# Patient Record
Sex: Female | Born: 1975 | Hispanic: Yes | Marital: Married | Smoking: Never smoker
Health system: Southern US, Community
[De-identification: ages and names within clinical notes are randomized; demographics above are authoritative.]

---

## 2014-02-13 ENCOUNTER — Ambulatory Visit (INDEPENDENT_AMBULATORY_CARE_PROVIDER_SITE_OTHER): Payer: Managed Care, Other (non HMO)

## 2014-02-13 ENCOUNTER — Ambulatory Visit (INDEPENDENT_AMBULATORY_CARE_PROVIDER_SITE_OTHER): Payer: Managed Care, Other (non HMO) | Admitting: Family Medicine

## 2014-02-13 ENCOUNTER — Encounter: Payer: Self-pay | Admitting: Family Medicine

## 2014-02-13 ENCOUNTER — Ambulatory Visit (INDEPENDENT_AMBULATORY_CARE_PROVIDER_SITE_OTHER): Payer: Managed Care, Other (non HMO) | Admitting: *Deleted

## 2014-02-13 ENCOUNTER — Encounter (INDEPENDENT_AMBULATORY_CARE_PROVIDER_SITE_OTHER): Payer: Self-pay

## 2014-02-13 VITALS — BP 106/63 | HR 87 | Temp 98.0°F | Ht 64.0 in | Wt 118.0 lb

## 2014-02-13 DIAGNOSIS — M25511 Pain in right shoulder: Secondary | ICD-10-CM

## 2014-02-13 DIAGNOSIS — M542 Cervicalgia: Secondary | ICD-10-CM

## 2014-02-13 DIAGNOSIS — Z23 Encounter for immunization: Secondary | ICD-10-CM

## 2014-02-13 MED ORDER — HYDROCODONE-ACETAMINOPHEN 5-325 MG PO TABS: 1.0000 | ORAL_TABLET | Freq: Four times a day (QID) | ORAL | Status: DC | PRN

## 2014-02-13 MED ORDER — NAPROXEN 500 MG PO TABS: 500.0000 mg | ORAL_TABLET | Freq: Two times a day (BID) | ORAL | Status: DC

## 2014-02-13 NOTE — Progress Notes (Signed)
   Subjective:    Patient ID: Nicholaus CorollaSonia Rodino, female    DOB: [birth year], 38 y.o.   MRN: [id number]  HPI Patient is here for c/o right sided shoulder pain and discomfort.  She states a month ago she was picking up a heavy object at work and she felt it pull her neck and shoulder and since she has been having difficulty raising her arm above her head and she is having some neck discomfort.  Review of Systems  Constitutional: Negative for fever.  HENT: Negative for ear pain.   Eyes: Negative for discharge.  Respiratory: Negative for cough.   Cardiovascular: Negative for chest pain.  Gastrointestinal: Negative for abdominal distention.  Endocrine: Negative for polyuria.  Genitourinary: Negative for difficulty urinating.  Musculoskeletal: Positive for myalgias. Negative for gait problem and neck pain.       C/o right shoulder discomfort and cervical spine discomfort.  Skin: Negative for color change and rash.  Neurological: Negative for speech difficulty and headaches.  Psychiatric/Behavioral: Negative for agitation.       Objective:    BP 106/63 mmHg  Pulse 87  Temp(Src) 98 F (36.7 C) (Oral)  Ht 5\' 4"  (1.626 m)  Wt 118 lb (53.524 kg)  BMI 20.24 kg/m2  LMP 02/07/2014 Physical Exam  Constitutional: She is oriented to person, place, and time. She appears well-developed and well-nourished.  HENT:  Head: Normocephalic and atraumatic.  Mouth/Throat: Oropharynx is clear and moist.  Eyes: Pupils are equal, round, and reactive to light.  Neck: Normal range of motion. Neck supple.  Cardiovascular: Normal rate and regular rhythm.   No murmur heard. Pulmonary/Chest: Effort normal and breath sounds normal.  Abdominal: Soft. Bowel sounds are normal. There is no tenderness.  Musculoskeletal: She exhibits tenderness.  Decreased ROM cervical spine and right arm with abduction.  Neurological: She is alert and oriented to person, place, and time.  Skin: Skin is warm and dry.  Psychiatric: She  has a normal mood and affect.   Cervical Spine Xray - No fx's Right shoulder Xray - Possible seperation right shoulder. Prelimnary reading by Angeline SlimWilliam Roseland Braun,FNP       Assessment & Plan:     ICD-9-CM ICD-10-CM   1. Pain in joint, shoulder region, right 719.41 M25.511 DG Cervical Spine Complete     naproxen (NAPROSYN) 500 MG tablet     HYDROcodone-acetaminophen (NORCO) 5-325 MG per tablet     DG Shoulder Right     DG Cervical Spine Complete  2. Cervicalgia 723.1 M54.2 naproxen (NAPROSYN) 500 MG tablet     HYDROcodone-acetaminophen (NORCO) 5-325 MG per tablet     DG Shoulder Right     DG Cervical Spine Complete     No Follow-up on file.  Deatra CanterWilliam J Neythan Kozlov FNP

## 2015-11-23 IMAGING — CR DG CERVICAL SPINE COMPLETE 4+V
5 series · 5 of 5 positions shown · non-contrast
Comparison: None.

CLINICAL DATA: Right shoulder pain radiating to the right arm

EXAM:
CERVICAL SPINE  4+ VIEWS

[view not recorded (1 of 5)]
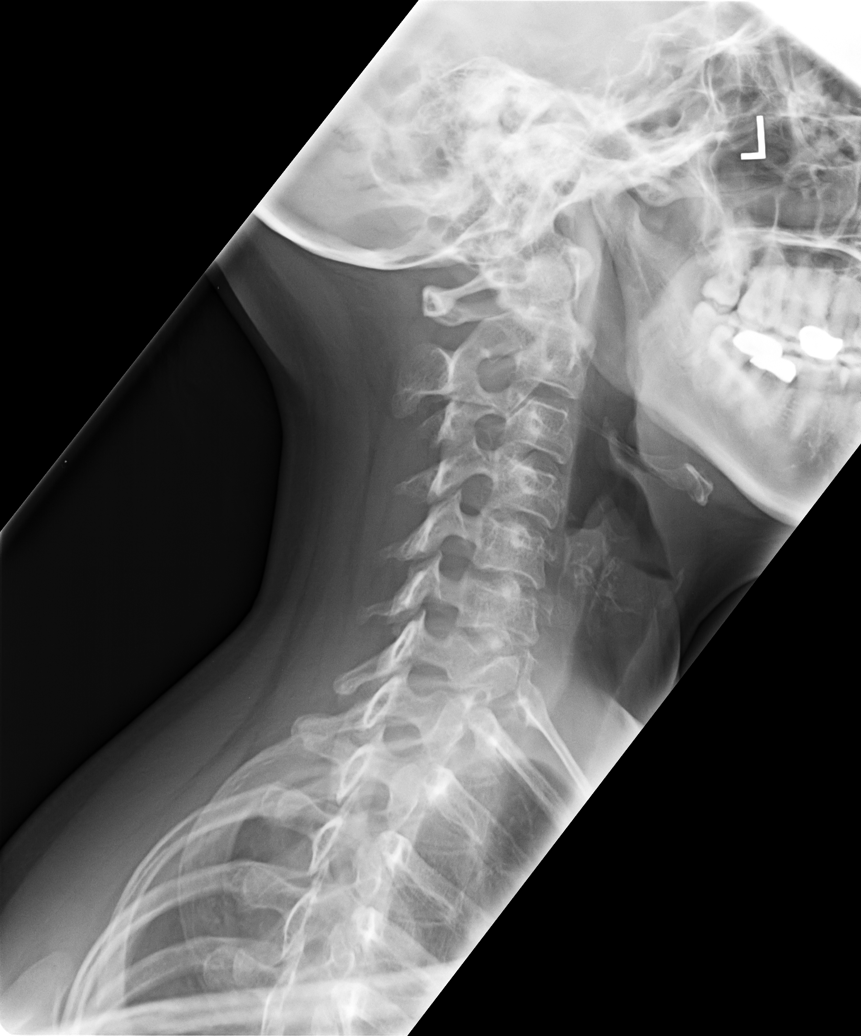

[view not recorded (2 of 5)]
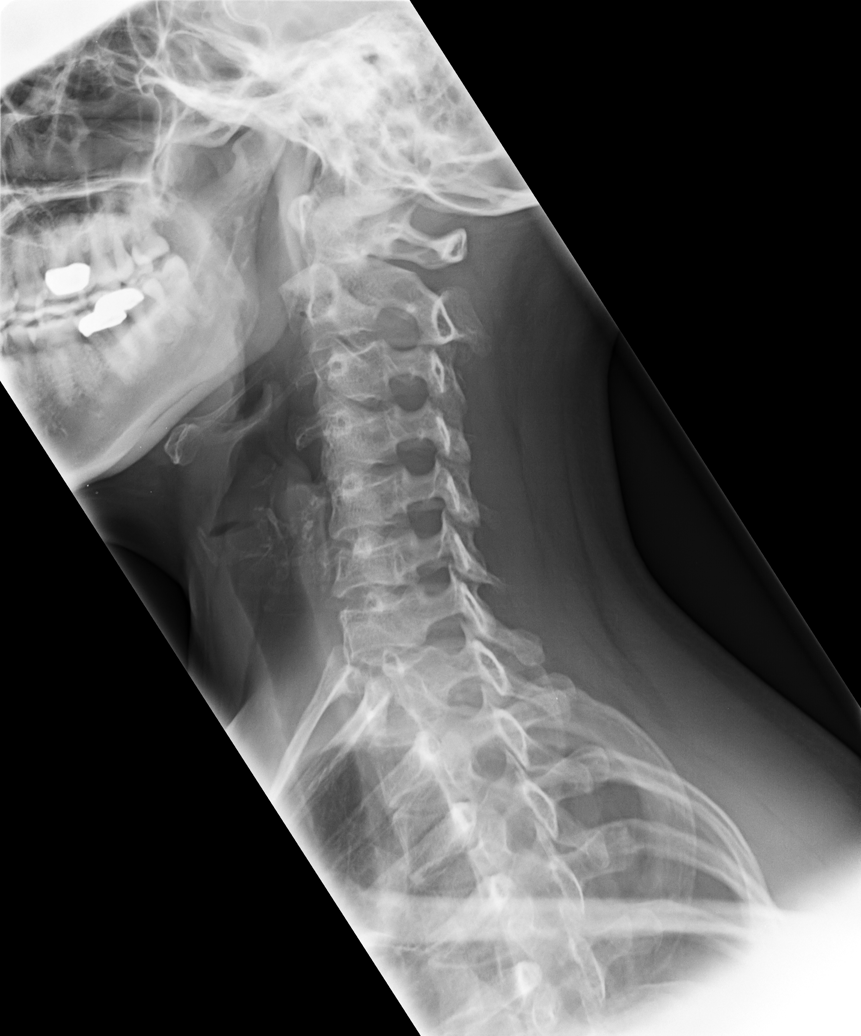

[view not recorded (3 of 5)]
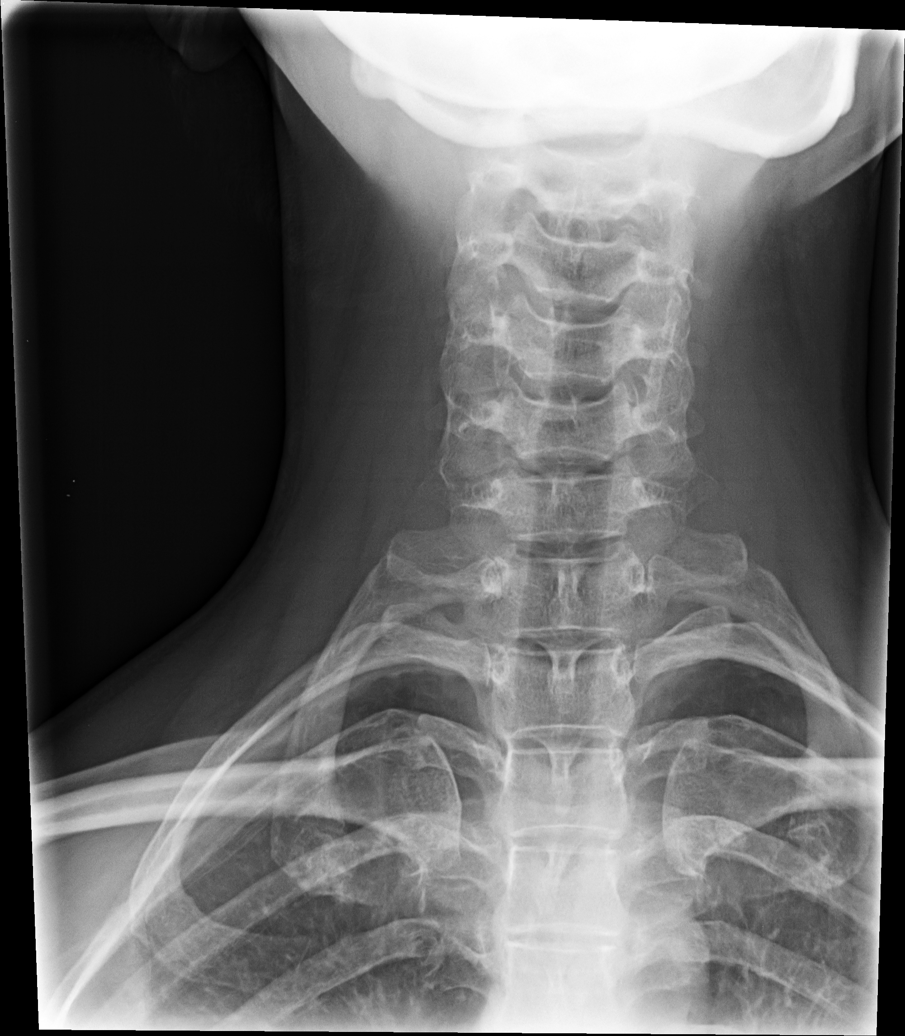

[view not recorded (4 of 5)]
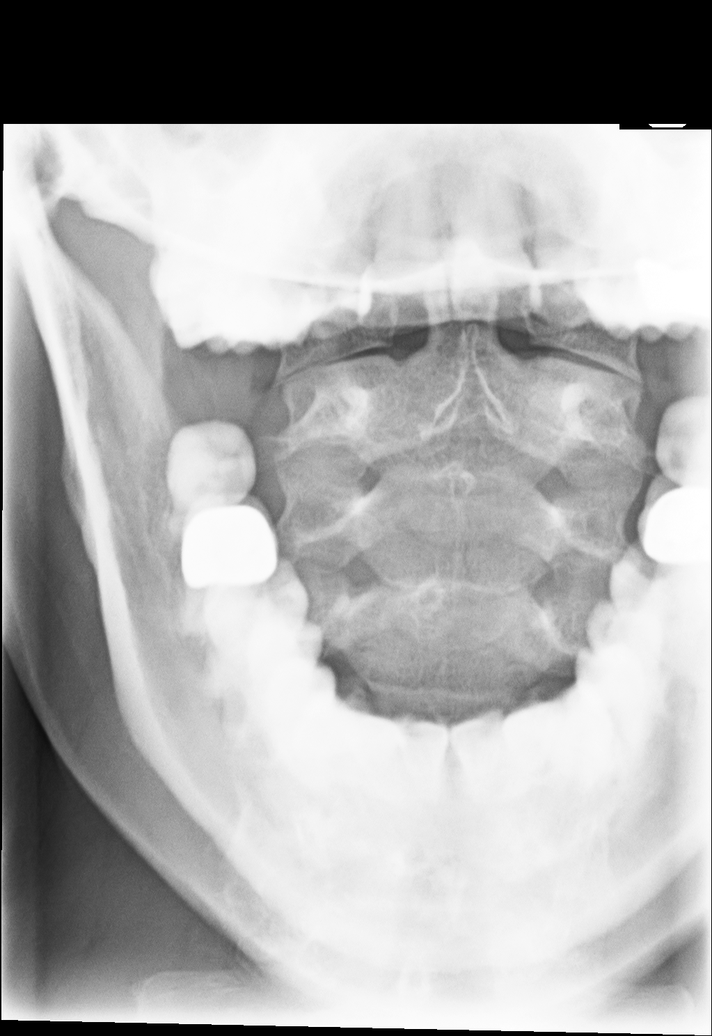

[view not recorded (5 of 5)]
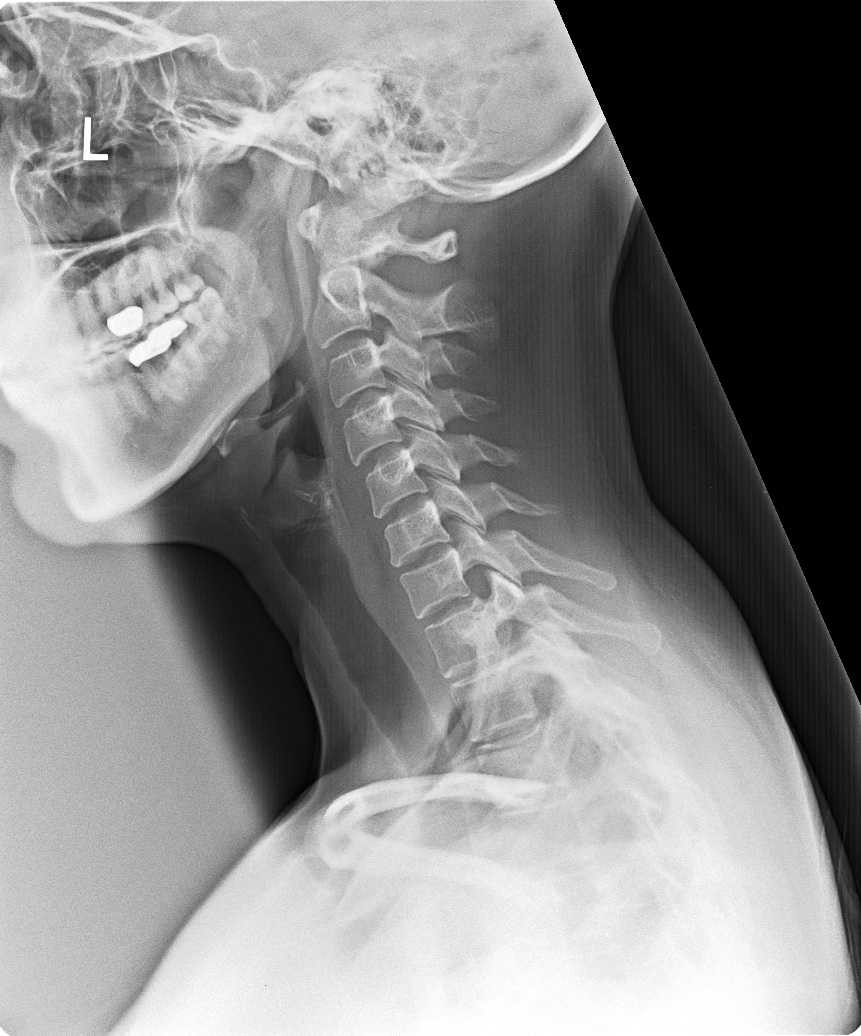

[5 of 5 positions shown; findings below may reference images not displayed]

FINDINGS: The cervical vertebrae are straightened in alignment. Intervertebral
disc spaces appear normal. No significant degenerative disc disease
is seen. No prevertebral soft tissue swelling is noted. On oblique
views, the foramina are patent. The odontoid process is intact. The
lung apices are clear.
IMPRESSION: Straightened alignment. Intervertebral disc spaces are grossly
normal.

## 2016-01-31 ENCOUNTER — Encounter: Payer: Self-pay | Admitting: Nurse Practitioner

## 2016-01-31 ENCOUNTER — Ambulatory Visit (INDEPENDENT_AMBULATORY_CARE_PROVIDER_SITE_OTHER): Payer: Managed Care, Other (non HMO) | Admitting: Nurse Practitioner

## 2016-01-31 VITALS — BP 102/69 | HR 81 | Temp 97.6°F | Ht 64.0 in | Wt 121.0 lb

## 2016-01-31 DIAGNOSIS — N3000 Acute cystitis without hematuria: Secondary | ICD-10-CM | POA: Diagnosis not present

## 2016-01-31 DIAGNOSIS — M545 Low back pain, unspecified: Secondary | ICD-10-CM

## 2016-01-31 LAB — URINALYSIS, COMPLETE
BILIRUBIN UA: NEGATIVE
GLUCOSE, UA: NEGATIVE
Ketones, UA: NEGATIVE
Nitrite, UA: NEGATIVE
PH UA: 7.5 (ref 5.0–7.5)
PROTEIN UA: NEGATIVE
RBC UA: NEGATIVE
Specific Gravity, UA: 1.01 (ref 1.005–1.030)
Urobilinogen, Ur: 0.2 mg/dL (ref 0.2–1.0)

## 2016-01-31 LAB — MICROSCOPIC EXAMINATION
Epithelial Cells (non renal): >10 /hpf — AB (ref 0–10)
RENAL EPITHEL UA: NONE SEEN /HPF

## 2016-01-31 MED ORDER — NITROFURANTOIN MONOHYD MACRO 100 MG PO CAPS: 100.0000 mg | ORAL_CAPSULE | Freq: Two times a day (BID) | ORAL | 0 refills | Status: DC

## 2016-01-31 MED ORDER — CYCLOBENZAPRINE HCL 10 MG PO TABS: 10.0000 mg | ORAL_TABLET | Freq: Three times a day (TID) | ORAL | 1 refills | Status: DC | PRN

## 2016-01-31 MED ORDER — PREDNISONE 20 MG PO TABS: ORAL_TABLET | ORAL | 0 refills | Status: DC

## 2016-01-31 NOTE — Progress Notes (Signed)
   Subjective:    Patient ID: Nicholaus CorollaSonia Cremeans, female    DOB: April 23, 1975, 40 y.o.   MRN: [id number]  HPI Patient in c/o left lower back pain started. Noticed it on Tuesday afternoon and has gradually gotten worse. Has moved down in to her butt and around flank. Rates pain 7/10 currently. Denies any injury. She has tried ibuprofen and it only helps just a little. Pain increases with movement and improves with sitting still.    Review of Systems  Constitutional: Negative.   HENT: Negative.   Respiratory: Negative.   Cardiovascular: Negative.   Gastrointestinal: Negative.   Genitourinary: Negative.  Negative for dysuria, frequency and urgency.  Musculoskeletal: Positive for back pain (left lower back).  Neurological: Negative.   Psychiatric/Behavioral: Negative.   All other systems reviewed and are negative.      Objective:   Physical Exam  Constitutional: She is oriented to person, place, and time. She appears well-developed and well-nourished. No distress.  Cardiovascular: Normal rate, regular rhythm and normal heart sounds.   Abdominal: Soft. Bowel sounds are normal. She exhibits no distension. There is no tenderness. There is no rebound.  Musculoskeletal:  Rises slowly from sitting position with slight limp on left leg. FROM of lumbar spine with pain on rotatoion to right nad flexion. (-) SLR bil motort strength and sensation distally intact.  Neurological: She is alert and oriented to person, place, and time. She has normal reflexes.  Skin: Skin is warm.  Psychiatric: She has a normal mood and affect. Her behavior is normal. Judgment and thought content normal.   BP 102/69   Pulse 81   Temp 97.6 F (36.4 C) (Oral)   Ht 5\' 4"  (1.626 m)   Wt 121 lb (54.9 kg)   BMI 20.77 kg/m   UA- leuks 2+     Assessment & Plan:  1. Acute left-sided low back pain without sciatica Moist heat No heavy lifting or stooping Do not drive when take muscle relaxor - Urinalysis, Complete -  predniSONE (DELTASONE) 20 MG tablet; 2 po at sametime daily for 5 days  Dispense: 10 tablet; Refill: 0 - cyclobenzaprine (FLEXERIL) 10 MG tablet; Take 1 tablet (10 mg total) by mouth 3 (three) times daily as needed for muscle spasms.  Dispense: 30 tablet; Refill: 1  2. Acute cystitis without hematuria Force fluids RTO prn - nitrofurantoin, macrocrystal-monohydrate, (MACROBID) 100 MG capsule; Take 1 capsule (100 mg total) by mouth 2 (two) times daily. 1 po BId  Dispense: 14 capsule; Refill: 0  Mary-Margaret Daphine DeutscherMartin, FNP

## 2016-01-31 NOTE — Patient Instructions (Signed)
Back Pain, Adult Introduction Back pain is very common. The pain often gets better over time. The cause of back pain is usually not dangerous. Most people can learn to manage their back pain on their own. Follow these instructions at home: Watch your back pain for any changes. The following actions may help to lessen any pain you are feeling:  Stay active. Start with short walks on flat ground if you can. Try to walk farther each day.  Exercise regularly as told by your doctor. Exercise helps your back heal faster. It also helps avoid future injury by keeping your muscles strong and flexible.  Do not sit, drive, or stand in one place for more than 30 minutes.  Do not stay in bed. Resting more than 1-2 days can slow down your recovery.  Be careful when you bend or lift an object. Use good form when lifting:  Bend at your knees.  Keep the object close to your body.  Do not twist.  Sleep on a firm mattress. Lie on your side, and bend your knees. If you lie on your back, put a pillow under your knees.  Take medicines only as told by your doctor.  Put ice on the injured area.  Put ice in a plastic bag.  Place a towel between your skin and the bag.  Leave the ice on for 20 minutes, 2-3 times a day for the first 2-3 days. After that, you can switch between ice and heat packs.  Avoid feeling anxious or stressed. Find good ways to deal with stress, such as exercise.  Maintain a healthy weight. Extra weight puts stress on your back. Contact a doctor if:  You have pain that does not go away with rest or medicine.  You have worsening pain that goes down into your legs or buttocks.  You have pain that does not get better in one week.  You have pain at night.  You lose weight.  You have a fever or chills. Get help right away if:  You cannot control when you poop (bowel movement) or pee (urinate).  Your arms or legs feel weak.  Your arms or legs lose feeling  (numbness).  You feel sick to your stomach (nauseous) or throw up (vomit).  You have belly (abdominal) pain.  You feel like you may pass out (faint). This information is not intended to replace advice given to you by your health care provider. Make sure you discuss any questions you have with your health care provider. Document Released: 08/19/2007 Document Revised: 08/08/2015 Document Reviewed: 07/04/2013  2017 Elsevier  

## 2016-12-24 ENCOUNTER — Encounter: Payer: Self-pay | Admitting: Family Medicine

## 2016-12-24 ENCOUNTER — Ambulatory Visit (INDEPENDENT_AMBULATORY_CARE_PROVIDER_SITE_OTHER): Payer: Managed Care, Other (non HMO) | Admitting: Family Medicine

## 2016-12-24 VITALS — BP 107/69 | HR 70 | Temp 97.5°F | Ht 64.0 in | Wt 116.8 lb

## 2016-12-24 DIAGNOSIS — Z23 Encounter for immunization: Secondary | ICD-10-CM | POA: Diagnosis not present

## 2016-12-24 DIAGNOSIS — G5601 Carpal tunnel syndrome, right upper limb: Secondary | ICD-10-CM

## 2016-12-24 NOTE — Progress Notes (Signed)
   HPI  Patient presents today here with right wrist pain.  Patient states she changed job at [employer], after that she began to develop right wrist pain and now numbness of her second third and fourth digit.  She has no weakness of the hand. She has been wearing a cock up splint for a few weeks at night with not much improvement.    PMH: Smoking status noted ROS: Per HPI  Objective: BP 107/69   Pulse 70   Temp (!) 97.5 F (36.4 C) (Oral)   Ht  (1.626 m)   Wt 116 lb 12.8 oz (53 kg)   BMI 20.05 kg/m  Gen: NAD, alert, cooperative with exam HEENT: NCAT, EOMI, PERRL CV: RRR, good S1/S2, no murmur Resp: CTABL, no wheezes, non-labored Ext: No edema, warm Neuro: Alert and oriented, Grip strength 5/5 BL  MSK:  Positive Phalen and Tinnel's test on the R   Assessment and plan:  # Carpal tunnel syndrome New diagnosis No signs of loss of strength, however she does have persistent symptoms, she has pursued conservative therapy with a cock-up splint. Hand surgery referral today Discussed other conservative therapy , continue cock-up splint especially at night Also written her a letter to change back to her old position      Orders Placed This Encounter  Procedures  . Ambulatory referral to Hand Surgery    Referral Priority:   Routine    Referral Type:   Surgical    Referral Reason:   Specialty Services Required    [facility] Provider[facility]   [facility]    Requested Specialty:   Hand Surgery    Number of Visits Requested:   1     Murtis Sink, MD [facility] 12/24/2016, 9:20 AM

## 2016-12-24 NOTE — Patient Instructions (Signed)
Great to meet you!  Come back oor call with any concerns. I would be glad to refer you to a Hand center to pursue further treatment.   Continue trying the cock-up splint at night, also try ice after work.

## 2019-03-13 ENCOUNTER — Other Ambulatory Visit: Payer: Self-pay

## 2019-03-13 ENCOUNTER — Encounter: Payer: Self-pay | Admitting: Family Medicine

## 2019-03-13 ENCOUNTER — Ambulatory Visit (INDEPENDENT_AMBULATORY_CARE_PROVIDER_SITE_OTHER): Payer: Managed Care, Other (non HMO) | Admitting: Family Medicine

## 2019-03-13 VITALS — BP 101/68 | HR 69 | Temp 97.4°F | Resp 20 | Ht 64.0 in | Wt 118.0 lb

## 2019-03-13 DIAGNOSIS — Z13220 Encounter for screening for lipoid disorders: Secondary | ICD-10-CM | POA: Diagnosis not present

## 2019-03-13 DIAGNOSIS — Z0000 Encounter for general adult medical examination without abnormal findings: Secondary | ICD-10-CM | POA: Diagnosis not present

## 2019-03-13 DIAGNOSIS — Z1231 Encounter for screening mammogram for malignant neoplasm of breast: Secondary | ICD-10-CM | POA: Diagnosis not present

## 2019-03-13 DIAGNOSIS — Z130 Encounter for screening for diseases of the blood and blood-forming organs and certain disorders involving the immune mechanism: Secondary | ICD-10-CM | POA: Diagnosis not present

## 2019-03-13 DIAGNOSIS — Z23 Encounter for immunization: Secondary | ICD-10-CM | POA: Diagnosis not present

## 2019-03-13 DIAGNOSIS — Z124 Encounter for screening for malignant neoplasm of cervix: Secondary | ICD-10-CM

## 2019-03-13 DIAGNOSIS — Z1329 Encounter for screening for other suspected endocrine disorder: Secondary | ICD-10-CM

## 2019-03-13 DIAGNOSIS — Z13228 Encounter for screening for other metabolic disorders: Secondary | ICD-10-CM

## 2019-03-13 NOTE — Patient Instructions (Signed)
Health Maintenance, Female Adopting a healthy lifestyle and getting preventive care are important in promoting health and wellness. Ask your health care provider about:  The right schedule for you to have regular tests and exams.  Things you can do on your own to prevent diseases and keep yourself healthy. What should I know about diet, weight, and exercise? Eat a healthy diet   Eat a diet that includes plenty of vegetables, fruits, low-fat dairy products, and lean protein.  Do not eat a lot of foods that are high in solid fats, added sugars, or sodium. Maintain a healthy weight Body mass index (BMI) is used to identify weight problems. It estimates body fat based on height and weight. Your health care provider can help determine your BMI and help you achieve or maintain a healthy weight. Get regular exercise Get regular exercise. This is one of the most important things you can do for your health. Most adults should:  Exercise for at least 150 minutes each week. The exercise should increase your heart rate and make you sweat (moderate-intensity exercise).  Do strengthening exercises at least twice a week. This is in addition to the moderate-intensity exercise.  Spend less time sitting. Even light physical activity can be beneficial. Watch cholesterol and blood lipids Have your blood tested for lipids and cholesterol at 44 years of age, then have this test every 5 years. Have your cholesterol levels checked more often if:  Your lipid or cholesterol levels are high.  You are older than 44 years of age.  You are at high risk for heart disease. What should I know about cancer screening? Depending on your health history and family history, you may need to have cancer screening at various ages. This may include screening for:  Breast cancer.  Cervical cancer.  Colorectal cancer.  Skin cancer.  Lung cancer. What should I know about heart disease, diabetes, and high blood  pressure? Blood pressure and heart disease  High blood pressure causes heart disease and increases the risk of stroke. This is more likely to develop in people who have high blood pressure readings, are of African descent, or are overweight.  Have your blood pressure checked: ? Every 3-5 years if you are 18-39 years of age. ? Every year if you are 40 years old or older. Diabetes Have regular diabetes screenings. This checks your fasting blood sugar level. Have the screening done:  Once every three years after age 40 if you are at a normal weight and have a low risk for diabetes.  More often and at a younger age if you are overweight or have a high risk for diabetes. What should I know about preventing infection? Hepatitis B If you have a higher risk for hepatitis B, you should be screened for this virus. Talk with your health care provider to find out if you are at risk for hepatitis B infection. Hepatitis C Testing is recommended for:  Everyone born from 1945 through 1965.  Anyone with known risk factors for hepatitis C. Sexually transmitted infections (STIs)  Get screened for STIs, including gonorrhea and chlamydia, if: ? You are sexually active and are younger than 44 years of age. ? You are older than 44 years of age and your health care provider tells you that you are at risk for this type of infection. ? Your sexual activity has changed since you were last screened, and you are at increased risk for chlamydia or gonorrhea. Ask your health care provider if   you are at risk.  Ask your health care provider about whether you are at high risk for HIV. Your health care provider may recommend a prescription medicine to help prevent HIV infection. If you choose to take medicine to prevent HIV, you should first get tested for HIV. You should then be tested every 3 months for as long as you are taking the medicine. Pregnancy  If you are about to stop having your period (premenopausal) and  you may become pregnant, seek counseling before you get pregnant.  Take 400 to 800 micrograms (mcg) of folic acid every day if you become pregnant.  Ask for birth control (contraception) if you want to prevent pregnancy. Osteoporosis and menopause Osteoporosis is a disease in which the bones lose minerals and strength with aging. This can result in bone fractures. If you are 65 years old or older, or if you are at risk for osteoporosis and fractures, ask your health care provider if you should:  Be screened for bone loss.  Take a calcium or vitamin D supplement to lower your risk of fractures.  Be given hormone replacement therapy (HRT) to treat symptoms of menopause. Follow these instructions at home: Lifestyle  Do not use any products that contain nicotine or tobacco, such as cigarettes, e-cigarettes, and chewing tobacco. If you need help quitting, ask your health care provider.  Do not use street drugs.  Do not share needles.  Ask your health care provider for help if you need support or information about quitting drugs. Alcohol use  Do not drink alcohol if: ? Your health care provider tells you not to drink. ? You are pregnant, may be pregnant, or are planning to become pregnant.  If you drink alcohol: ? Limit how much you use to 0-1 drink a day. ? Limit intake if you are breastfeeding.  Be aware of how much alcohol is in your drink. In the [place] one drink equals one 12 oz bottle of beer (355 mL), one 5 oz glass of wine (148 mL), or one 1 oz glass of hard liquor (44 mL). General instructions  Schedule regular health, dental, and eye exams.  Stay current with your vaccines.  Tell your health care provider if: ? You often feel depressed. ? You have ever been abused or do not feel safe at home. Summary  Adopting a healthy lifestyle and getting preventive care are important in promoting health and wellness.  Follow your health care provider's instructions about healthy  diet, exercising, and getting tested or screened for diseases.  Follow your health care provider's instructions on monitoring your cholesterol and blood pressure. This information is not intended to replace advice given to you by your health care provider. Make sure you discuss any questions you have with your health care provider. Document Released: 09/15/2010 Document Revised: 02/23/2018 Document Reviewed: 02/23/2018 Elsevier Patient Education  2020 [employer]  

## 2019-03-13 NOTE — Progress Notes (Signed)
Subjective:  Patient ID: Renee Mitchell Winkleman, female    DOB: 11-24-1975, 43 y.o.   MRN: [id number]  Patient Care Team: Sonny Mastersakes, Maryalyce Sanjuan M, FNP as PCP - General (Family Medicine)   Chief Complaint:  Gynecologic Exam and Annual Exam   HPI: Renee Mitchell Renee Mitchell is a 43 y.o. female presenting on 03/13/2019 for Gynecologic Exam and Annual Exam   Pt presents today for her annual physical exam with PAP. Pt states she has not been to the doctor in a few years. States she has been doing very well overall. She does not take any medications on a daily basis and has no complaints. She has never had a mammogram and would like to have one ordered. She denies any abnormal vaginal discharge or bleeding.    Relevant past medical, surgical, family, and social history reviewed and updated as indicated.  Allergies and medications reviewed and updated. Date reviewed: Chart in Epic.   History reviewed. No pertinent past medical history.  History reviewed. No pertinent surgical history.  Social History   Socioeconomic History  . Marital status: Married    Spouse name: Not on file  . Number of children: Not on file  . Years of education: Not on file  . Highest education level: Not on file  Occupational History  . Not on file  Tobacco Use  . Smoking status: Never Smoker  . Smokeless tobacco: Never Used  Substance and Sexual Activity  . Alcohol use: Yes    Alcohol/week: 0.0 standard drinks    Comment: rare  . Drug use: No  . Sexual activity: Not on file  Other Topics Concern  . Not on file  Social History Narrative  . Not on file   Social Determinants of Health   Financial Resource Strain:   . Difficulty of Paying Living Expenses: Not on file  Food Insecurity:   . Worried About Programme researcher, broadcasting/film/videounning Out of Food in the Last Year: Not on file  . Ran Out of Food in the Last Year: Not on file  Transportation Needs:   . Lack of Transportation (Medical): Not on file  . Lack of Transportation (Non-Medical): Not on file    Physical Activity:   . Days of Exercise per Week: Not on file  . Minutes of Exercise per Session: Not on file  Stress:   . Feeling of Stress : Not on file  Social Connections:   . Frequency of Communication with Friends and Family: Not on file  . Frequency of Social Gatherings with Friends and Family: Not on file  . Attends Religious Services: Not on file  . Active Member of Clubs or Organizations: Not on file  . Attends BankerClub or Organization Meetings: Not on file  . Marital Status: Not on file  Intimate Partner Violence:   . Fear of Current or Ex-Partner: Not on file  . Emotionally Abused: Not on file  . Physically Abused: Not on file  . Sexually Abused: Not on file    No outpatient encounter medications on file as of 03/13/2019.   No facility-administered encounter medications on file as of 03/13/2019.    No Known Allergies  Review of Systems  Constitutional: Negative for activity change, appetite change, chills, diaphoresis, fatigue, fever and unexpected weight change.  HENT: Negative.   Eyes: Negative.  Negative for photophobia and visual disturbance.  Respiratory: Negative for cough, chest tightness and shortness of breath.   Cardiovascular: Negative for chest pain, palpitations and leg swelling.  Gastrointestinal: Negative for blood  in stool, constipation, diarrhea, nausea and vomiting.  Endocrine: Negative.   Genitourinary: Negative for decreased urine volume, difficulty urinating, dyspareunia, dysuria, enuresis, flank pain, frequency, genital sores, hematuria, menstrual problem, pelvic pain, urgency, vaginal bleeding, vaginal discharge and vaginal pain.  Musculoskeletal: Negative for arthralgias and myalgias.  Skin: Negative.   Allergic/Immunologic: Negative.   Neurological: Negative for dizziness, tremors, seizures, syncope, facial asymmetry, speech difficulty, weakness, light-headedness, numbness and headaches.  Hematological: Negative.   Psychiatric/Behavioral:  Negative for confusion, hallucinations, sleep disturbance and suicidal ideas.  All other systems reviewed and are negative.       Objective:  BP 101/68   Pulse 69   Temp (!) 97.4 F (36.3 C)   Resp 20   Ht 5\' 4"  (1.626 m)   Wt 118 lb (53.5 kg)   LMP 02/22/2019   SpO2 100%   BMI 20.25 kg/m    Wt Readings from Last 3 Encounters:  03/13/19 118 lb (53.5 kg)  12/24/16 116 lb 12.8 oz (53 kg)  01/31/16 121 lb (54.9 kg)    Physical Exam Vitals and nursing note reviewed.  Constitutional:      General: She is not in acute distress.    Appearance: Normal appearance. She is well-developed, well-groomed and normal weight. She is not ill-appearing, toxic-appearing or diaphoretic.  HENT:     Head: Normocephalic and atraumatic.     Jaw: There is normal jaw occlusion.     Right Ear: Hearing, tympanic membrane, ear canal and external ear normal.     Left Ear: Hearing, tympanic membrane, ear canal and external ear normal. There is no impacted cerumen.     Nose: Nose normal.     Mouth/Throat:     Lips: Pink.     Mouth: Mucous membranes are moist.     Pharynx: Oropharynx is clear. Uvula midline.  Eyes:     General: Lids are normal.     Extraocular Movements: Extraocular movements intact.     Conjunctiva/sclera: Conjunctivae normal.     Pupils: Pupils are equal, round, and reactive to light.  Neck:     Thyroid: No thyroid mass, thyromegaly or thyroid tenderness.     Vascular: No carotid bruit or JVD.     Trachea: Trachea and phonation normal.  Cardiovascular:     Rate and Rhythm: Normal rate and regular rhythm.     Chest Wall: PMI is not displaced.     Pulses: Normal pulses.     Heart sounds: Normal heart sounds. No murmur. No friction rub. No gallop.   Pulmonary:     Effort: Pulmonary effort is normal. No respiratory distress.     Breath sounds: Normal breath sounds. No wheezing.  Chest:     Chest wall: No mass or tenderness.     Breasts:        Right: Normal.        Left:  Normal.  Abdominal:     General: Bowel sounds are normal. There is no distension or abdominal bruit.     Palpations: Abdomen is soft. There is no hepatomegaly or splenomegaly.     Tenderness: There is no abdominal tenderness. There is no right CVA tenderness or left CVA tenderness.     Hernia: No hernia is present. There is no hernia in the left inguinal area or right inguinal area.  Genitourinary:    General: Normal vulva.     Exam position: Lithotomy position.     Pubic Area: No rash or pubic lice.  Labia:        Right: No rash, tenderness, lesion or injury.        Left: No rash, tenderness, lesion or injury.      Urethra: No prolapse, urethral pain, urethral swelling or urethral lesion.     Vagina: Normal. No vaginal discharge.     Cervix: Normal.     Uterus: Normal.      Adnexa: Right adnexa normal and left adnexa normal.     Rectum: Normal.  Musculoskeletal:        General: Normal range of motion.     Cervical back: Normal range of motion and neck supple.     Right lower leg: No edema.     Left lower leg: No edema.  Lymphadenopathy:     Cervical: No cervical adenopathy.     Upper Body:     Right upper body: No supraclavicular, axillary or pectoral adenopathy.     Left upper body: No supraclavicular, axillary or pectoral adenopathy.     Lower Body: No right inguinal adenopathy. No left inguinal adenopathy.  Skin:    General: Skin is warm and dry.     Capillary Refill: Capillary refill takes less than 2 seconds.     Coloration: Skin is not cyanotic, jaundiced or pale.     Findings: No rash.  Neurological:     General: No focal deficit present.     Mental Status: She is alert and oriented to person, place, and time.     Cranial Nerves: Cranial nerves are intact. No cranial nerve deficit.     Sensory: Sensation is intact. No sensory deficit.     Motor: Motor function is intact. No weakness.     Coordination: Coordination is intact. Coordination normal.     Gait: Gait is  intact. Gait normal.     Deep Tendon Reflexes: Reflexes are normal and symmetric. Reflexes normal.  Psychiatric:        Attention and Perception: Attention and perception normal.        Mood and Affect: Mood and affect normal.        Speech: Speech normal.        Behavior: Behavior normal. Behavior is cooperative.        Thought Content: Thought content normal.        Cognition and Memory: Cognition and memory normal.        Judgment: Judgment normal.     Results for orders placed or performed in visit on 01/31/16  Microscopic Examination   URINE  Result Value Ref Range   WBC, UA 6-10 (A) 0 - 5 /hpf   RBC, UA 0-2 0 - 2 /hpf   Epithelial Cells (non renal) >10 (A) 0 - 10 /hpf   Renal Epithel, UA None seen None seen /hpf   Bacteria, UA Few None seen/Few  Urinalysis, Complete  Result Value Ref Range   Specific Gravity, UA 1.010 1.005 - 1.030   pH, UA 7.5 5.0 - 7.5   Color, UA Yellow Yellow   Appearance Ur Clear Clear   Leukocytes, UA 2+ (A) Negative   Protein, UA Negative Negative/Trace   Glucose, UA Negative Negative   Ketones, UA Negative Negative   RBC, UA Negative Negative   Bilirubin, UA Negative Negative   Urobilinogen, Ur 0.2 0.2 - 1.0 mg/dL   Nitrite, UA Negative Negative   Microscopic Examination See below:        Pertinent labs & imaging results that were available during my  care of the patient were reviewed by me and considered in my medical decision making.  Assessment & Plan:  Nuria was seen today for gynecologic exam and annual exam.  Diagnoses and all orders for this visit:  Annual physical exam Health maintenance discussed. Diet and exercise encouraged. Labs pending. Mammogram ordered.  -     CBC with Differential -     Comprehensive metabolic panel -     Lipid panel -     TSH -     MM SCREENING BREAST TOMO BILATERAL; Future -     PAP age 23-65  Visit for screening mammogram -     MM SCREENING BREAST TOMO BILATERAL; Future  Screening for  deficiency anemia -     CBC with Differential  Screening for lipoid disorders -     Lipid panel  Screening for endocrine, metabolic and immunity disorder -     Comprehensive metabolic panel -     TSH  Cervical cancer screening -     PAP age 57-65     Continue all other maintenance medications.  Follow up plan: Return in about 1 year (around 03/12/2020), or if symptoms worsen or fail to improve.  Continue healthy lifestyle choices, including diet (rich in fruits, vegetables, and lean proteins, and low in salt and simple carbohydrates) and exercise (at least 30 minutes of moderate physical activity daily).  Educational handout given for health maintenance   The above assessment and management plan was discussed with the patient. The patient verbalized understanding of and has agreed to the management plan. Patient is aware to call the clinic if they develop any new symptoms or if symptoms persist or worsen. Patient is aware when to return to the clinic for a follow-up visit. Patient educated on when it is appropriate to go to the emergency department.   Kari Baars, FNP-C [facility] [phone]

## 2019-03-13 NOTE — Addendum Note (Signed)
Addended by: Zannie Cove on: 03/13/2019 03:05 PM   Modules accepted: Orders

## 2019-03-14 LAB — COMPREHENSIVE METABOLIC PANEL
ALT: 21 IU/L (ref 0–32)
AST: 20 IU/L (ref 0–40)
Albumin/Globulin Ratio: 1.8 (ref 1.2–2.2)
Albumin: 4.2 g/dL (ref 3.8–4.8)
Alkaline Phosphatase: 61 IU/L (ref 39–117)
BUN/Creatinine Ratio: 16 (ref 9–23)
BUN: 12 mg/dL (ref 6–24)
Bilirubin Total: <0.2 mg/dL (ref 0.0–1.2)
CO2: 23 mmol/L (ref 20–29)
Calcium: 9 mg/dL (ref 8.7–10.2)
Chloride: 103 mmol/L (ref 96–106)
Creatinine, Ser: 0.73 mg/dL (ref 0.57–1.00)
GFR calc Af Amer: 116 mL/min/{1.73_m2} (ref >59)
GFR calc non Af Amer: 100 mL/min/{1.73_m2} (ref >59)
Globulin, Total: 2.4 g/dL (ref 1.5–4.5)
Glucose: 94 mg/dL (ref 65–99)
Potassium: 3.6 mmol/L (ref 3.5–5.2)
Sodium: 140 mmol/L (ref 134–144)
Total Protein: 6.6 g/dL (ref 6.0–8.5)

## 2019-03-14 LAB — CBC WITH DIFFERENTIAL/PLATELET
Basophils Absolute: 0 10*3/uL (ref 0.0–0.2)
Basos: 0 %
EOS (ABSOLUTE): 0.1 10*3/uL (ref 0.0–0.4)
Eos: 1 %
Hematocrit: 39.3 % (ref 34.0–46.6)
Hemoglobin: 13.6 g/dL (ref 11.1–15.9)
Immature Grans (Abs): 0 10*3/uL (ref 0.0–0.1)
Immature Granulocytes: 0 %
Lymphocytes Absolute: 2.6 10*3/uL (ref 0.7–3.1)
Lymphs: 35 %
MCH: 31.7 pg (ref 26.6–33.0)
MCHC: 34.6 g/dL (ref 31.5–35.7)
MCV: 92 fL (ref 79–97)
Monocytes Absolute: 0.4 10*3/uL (ref 0.1–0.9)
Monocytes: 6 %
Neutrophils Absolute: 4.2 10*3/uL (ref 1.4–7.0)
Neutrophils: 58 %
Platelets: 236 10*3/uL (ref 150–450)
RBC: 4.29 x10E6/uL (ref 3.77–5.28)
RDW: 12.9 % (ref 11.7–15.4)
WBC: 7.3 10*3/uL (ref 3.4–10.8)

## 2019-03-14 LAB — LIPID PANEL
Chol/HDL Ratio: 3.7 ratio (ref 0.0–4.4)
Cholesterol, Total: 274 mg/dL — ABNORMAL HIGH (ref 100–199)
HDL: 74 mg/dL (ref >39)
LDL Chol Calc (NIH): 169 mg/dL — ABNORMAL HIGH (ref 0–99)
Triglycerides: 171 mg/dL — ABNORMAL HIGH (ref 0–149)
VLDL Cholesterol Cal: 31 mg/dL (ref 5–40)

## 2019-03-14 LAB — TSH: TSH: 2.68 u[IU]/mL (ref 0.450–4.500)

## 2019-03-17 LAB — IGP, APTIMA HPV, RFX 16/18,45: HPV Aptima: NEGATIVE

## 2020-03-07 ENCOUNTER — Other Ambulatory Visit: Payer: Self-pay

## 2020-03-07 ENCOUNTER — Encounter: Payer: Self-pay | Admitting: Nurse Practitioner

## 2020-03-07 ENCOUNTER — Ambulatory Visit (INDEPENDENT_AMBULATORY_CARE_PROVIDER_SITE_OTHER): Payer: Managed Care, Other (non HMO) | Admitting: Nurse Practitioner

## 2020-03-07 ENCOUNTER — Telehealth: Payer: Self-pay

## 2020-03-07 VITALS — BP 105/65 | HR 74 | Temp 97.8°F | Resp 20 | Ht 64.0 in | Wt 119.0 lb

## 2020-03-07 DIAGNOSIS — M7711 Lateral epicondylitis, right elbow: Secondary | ICD-10-CM | POA: Diagnosis not present

## 2020-03-07 DIAGNOSIS — R519 Headache, unspecified: Secondary | ICD-10-CM

## 2020-03-07 DIAGNOSIS — Z0000 Encounter for general adult medical examination without abnormal findings: Secondary | ICD-10-CM

## 2020-03-07 DIAGNOSIS — Z0001 Encounter for general adult medical examination with abnormal findings: Secondary | ICD-10-CM

## 2020-03-07 MED ORDER — NAPROXEN 500 MG PO TABS: 500.0000 mg | ORAL_TABLET | Freq: Two times a day (BID) | ORAL | 1 refills | Status: DC

## 2020-03-07 NOTE — Telephone Encounter (Signed)
That will be fine. 

## 2020-03-07 NOTE — Patient Instructions (Addendum)
Tennis Elbow Tennis elbow is swelling (inflammation) in your outer forearm, near your elbow. Swelling affects the tissues that connect muscle to bone (tendons). Tennis elbow can happen in any sport or job in which you use your elbow too much. It is caused by doing the same motion over and over. Tennis elbow can cause:  Pain and tenderness in your forearm and the outer part of your elbow. You may have pain all the time, or only when using the arm.  A burning feeling. This runs from your elbow through your arm.  Weak grip in your hand. Follow these instructions at home: Activity  Rest your elbow and wrist. Avoid activities that cause problems, as told by your doctor.  If told by your doctor, wear an elbow strap to reduce stress on the area.  Do physical therapy exercises as told.  If you lift an object, lift it with your palm facing up. This is easier on your elbow. Lifestyle  If your tennis elbow is caused by sports, check your equipment and make sure that: ? You are using it correctly. ? It fits you well.  If your tennis elbow is caused by work or by using a computer, take breaks often to stretch your arm. Talk with your manager about how you can manage your condition at work. If you have a brace:  Wear the brace as told by your doctor. Remove it only as told by your doctor.  Loosen the brace if your fingers tingle, get numb, or turn cold and blue.  Keep the brace clean.  If the brace is not waterproof, ask your doctor if you may take the brace off for bathing. If you must keep the brace on while bathing: ? Do not let it get wet. ? Cover it with a watertight covering when you take a bath or a shower. General instructions   If told, put ice on the painful area: ? Put ice in a plastic bag. ? Place a towel between your skin and the bag. ? Leave the ice on for 20 minutes, 2-3 times a day.  Take over-the-counter and prescription medicines only as told by your doctor.  Keep  all follow-up visits as told by your doctor. This is important. Contact a doctor if:  Your pain does not get better with treatment.  Your pain gets worse.  You have weakness in your forearm, hand, or fingers.  You cannot feel your forearm, hand, or fingers. Summary  Tennis elbow is swelling (inflammation) in your outer forearm, near your elbow.  Tennis elbow is caused by doing the same motion over and over.  Rest your elbow and wrist. Avoid activities that cause problems, as told by your doctor.  If told, put ice on the painful area for 20 minutes, 2-3 times a day. This information is not intended to replace advice given to you by your health care provider. Make sure you discuss any questions you have with your health care provider. Document Revised: 11/26/2017 Document Reviewed: 12/15/2016 Elsevier Patient Education  2020 [employer]. Exercising to Stay Healthy To become healthy and stay healthy, it is recommended that you do moderate-intensity and vigorous-intensity exercise. You can tell that you are exercising at a moderate intensity if your heart starts beating faster and you start breathing faster but can still hold a conversation. You can tell that you are exercising at a vigorous intensity if you are breathing much harder and faster and cannot hold a conversation while exercising. Exercising regularly  is important. It has many health benefits, such as:  Improving overall fitness, flexibility, and endurance.  Increasing bone density.  Helping with weight control.  Decreasing body fat.  Increasing muscle strength.  Reducing stress and tension.  Improving overall health. How often should I exercise? Choose an activity that you enjoy, and set realistic goals. Your health care provider can help you make an activity plan that works for you. Exercise regularly as told by your health care provider. This may include:  Doing strength training two times a week, such  as: ? Lifting weights. ? Using resistance bands. ? Push-ups. ? Sit-ups. ? Yoga.  Doing a certain intensity of exercise for a given amount of time. Choose from these options: ? A total of 150 minutes of moderate-intensity exercise every week. ? A total of 75 minutes of vigorous-intensity exercise every week. ? A mix of moderate-intensity and vigorous-intensity exercise every week. Children, pregnant women, people who have not exercised regularly, people who are overweight, and older adults may need to talk with a health care provider about what activities are safe to do. If you have a medical condition, be sure to talk with your health care provider before you start a new exercise program. What are some exercise ideas? Moderate-intensity exercise ideas include:  Walking 1 mile (1.6 km) in about 15 minutes.  Biking.  Hiking.  Golfing.  Dancing.  Water aerobics. Vigorous-intensity exercise ideas include:  Walking 4.5 miles (7.2 km) or more in about 1 hour.  Jogging or running 5 miles (8 km) in about 1 hour.  Biking 10 miles (16.1 km) or more in about 1 hour.  Lap swimming.  Roller-skating or in-line skating.  Cross-country skiing.  Vigorous competitive sports, such as football, basketball, and soccer.  Jumping rope.  Aerobic dancing. What are some everyday activities that can help me to get exercise?  Yard work, such as: ? Pushing a Surveyor, mining. ? Raking and bagging leaves.  Washing your car.  Pushing a stroller.  Shoveling snow.  Gardening.  Washing windows or floors. How can I be more active in my day-to-day activities?  Use stairs instead of an elevator.  Take a walk during your lunch break.  If you drive, park your car farther away from your work or school.  If you take public transportation, get off one stop early and walk the rest of the way.  Stand up or walk around during all of your indoor phone calls.  Get up, stretch, and walk around  every 30 minutes throughout the day.  Enjoy exercise with a friend. Support to continue exercising will help you keep a regular routine of activity. What guidelines can I follow while exercising?  Before you start a new exercise program, talk with your health care provider.  Do not exercise so much that you hurt yourself, feel dizzy, or get very short of breath.  Wear comfortable clothes and wear shoes with good support.  Drink plenty of water while you exercise to prevent dehydration or heat stroke.  Work out until your breathing and your heartbeat get faster. Where to find more information  [place] Department of Health and Human Services: ThisPath.fi  Centers for Disease Control and Prevention (CDC): FootballExhibition.com.br Summary  Exercising regularly is important. It will improve your overall fitness, flexibility, and endurance.  Regular exercise also will improve your overall health. It can help you control your weight, reduce stress, and improve your bone density.  Do not exercise so much that you hurt  yourself, feel dizzy, or get very short of breath.  Before you start a new exercise program, talk with your health care provider. This information is not intended to replace advice given to you by your health care provider. Make sure you discuss any questions you have with your health care provider. Document Revised: 02/12/2017 Document Reviewed: 01/21/2017 Elsevier Patient Education  2020 [employer].

## 2020-03-07 NOTE — Progress Notes (Signed)
Subjective:    Patient ID: Renee Mitchell, female    DOB: [birth year], 44 y.o.   MRN: [id number]   Chief Complaint: Annual Exam (No pap/)   HPI Patient comes in today for annual physical exam. She says she is doing well. She has no current medical problems and is on no meds.  BP Readings from Last 3 Encounters:  03/13/19 101/68  12/24/16 107/69  01/31/16 102/69   Lab Results  Component Value Date   CHOL 274 (H) 03/13/2019   HDL 74 03/13/2019   LDLCALC 169 (H) 03/13/2019   TRIG 171 (H) 03/13/2019   CHOLHDL 3.7 03/13/2019   C/O right elbow pain. She has been wearing tennis elbow strap at night which helps a little.  C/O headaches about 1-2 x a week. Started about 2 months ago. No visual changes, no nausea or vomiting.  Excedrin will relieve pain  Review of Systems  Constitutional: Negative for diaphoresis.  Eyes: Negative for pain.  Respiratory: Negative for shortness of breath.   Cardiovascular: Negative for chest pain, palpitations and leg swelling.  Gastrointestinal: Negative for abdominal pain.  Endocrine: Negative for polydipsia.  Skin: Negative for rash.  Neurological: Negative for dizziness, weakness and headaches.  Hematological: Does not bruise/bleed easily.  All other systems reviewed and are negative.      Objective:   Physical Exam Vitals and nursing note reviewed.  Constitutional:      General: She is not in acute distress.    Appearance: Normal appearance. She is well-developed and well-nourished.  HENT:     Head: Normocephalic.     Nose: Nose normal.     Mouth/Throat:     Mouth: Oropharynx is clear and moist.  Eyes:     Extraocular Movements: EOM normal.     Pupils: Pupils are equal, round, and reactive to light.  Neck:     Vascular: No carotid bruit or JVD.  Cardiovascular:     Rate and Rhythm: Normal rate and regular rhythm.     Pulses: Intact distal pulses.     Heart sounds: Normal heart sounds.  Pulmonary:     Effort: Pulmonary effort is  normal. No respiratory distress.     Breath sounds: Normal breath sounds. No wheezing or rales.  Chest:     Chest wall: No tenderness.  Abdominal:     General: Bowel sounds are normal. There is no distension or abdominal bruit. Aorta is normal.     Palpations: Abdomen is soft. There is no hepatomegaly, splenomegaly, mass or pulsatile mass.     Tenderness: There is no abdominal tenderness.  Musculoskeletal:        General: No edema. Normal range of motion.     Cervical back: Normal range of motion and neck supple.  Lymphadenopathy:     Cervical: No cervical adenopathy.  Skin:    General: Skin is warm and dry.  Neurological:     Mental Status: She is alert and oriented to person, place, and time.     Deep Tendon Reflexes: Reflexes are normal and symmetric.  Psychiatric:        Mood and Affect: Mood and affect normal.        Behavior: Behavior normal.        Thought Content: Thought content normal.        Judgment: Judgment normal.     BP 105/65   Pulse 74   Temp 97.8 F (36.6 C) (Temporal)   Resp 20   Ht 5' 4"  (  1.626 m)   Wt 119 lb (54 kg)   SpO2 100%   BMI 20.43 kg/m       Assessment & Plan:  Renee Mitchell comes in today with chief complaint of Annual Exam (No pap/Right arm pain for 3 months/)   Diagnosis and orders addressed:  1. Annual physical exam - CBC with Differential/Platelet - CMP14+EGFR - Lipid panel - Thyroid Panel With TSH  2. Lateral epicondylitis of right elbow Wear tennis elbow strap all the time - naproxen (NAPROSYN) 500 MG tablet; Take 1 tablet (500 mg total) by mouth 2 (two) times daily with a meal.  Dispense: 60 tablet; Refill: 1  3. New onset of headaches Avoid caffeine   Labs pending Health Maintenance reviewed Diet and exercise encouraged  Follow up plan: prn   Mary-Margaret Hassell Done, FNP

## 2020-03-08 LAB — CMP14+EGFR
ALT: 43 IU/L — ABNORMAL HIGH (ref 0–32)
AST: 28 IU/L (ref 0–40)
Albumin/Globulin Ratio: 2.3 — ABNORMAL HIGH (ref 1.2–2.2)
Albumin: 4.8 g/dL (ref 3.8–4.8)
Alkaline Phosphatase: 61 IU/L (ref 44–121)
BUN/Creatinine Ratio: 19 (ref 9–23)
BUN: 14 mg/dL (ref 6–24)
Bilirubin Total: 0.3 mg/dL (ref 0.0–1.2)
CO2: 22 mmol/L (ref 20–29)
Calcium: 9.6 mg/dL (ref 8.7–10.2)
Chloride: 102 mmol/L (ref 96–106)
Creatinine, Ser: 0.74 mg/dL (ref 0.57–1.00)
GFR calc Af Amer: 113 mL/min/{1.73_m2} (ref >59)
GFR calc non Af Amer: 98 mL/min/{1.73_m2} (ref >59)
Globulin, Total: 2.1 g/dL (ref 1.5–4.5)
Glucose: 82 mg/dL (ref 65–99)
Potassium: 4.1 mmol/L (ref 3.5–5.2)
Sodium: 137 mmol/L (ref 134–144)
Total Protein: 6.9 g/dL (ref 6.0–8.5)

## 2020-03-08 LAB — LIPID PANEL
Chol/HDL Ratio: 3.5 ratio (ref 0.0–4.4)
Cholesterol, Total: 296 mg/dL — ABNORMAL HIGH (ref 100–199)
HDL: 84 mg/dL (ref >39)
LDL Chol Calc (NIH): 204 mg/dL — ABNORMAL HIGH (ref 0–99)
Triglycerides: 60 mg/dL (ref 0–149)
VLDL Cholesterol Cal: 8 mg/dL (ref 5–40)

## 2020-03-08 LAB — CBC WITH DIFFERENTIAL/PLATELET
Basophils Absolute: 0 10*3/uL (ref 0.0–0.2)
Basos: 1 %
EOS (ABSOLUTE): 0.1 10*3/uL (ref 0.0–0.4)
Eos: 1 %
Hematocrit: 41.4 % (ref 34.0–46.6)
Hemoglobin: 13.7 g/dL (ref 11.1–15.9)
Immature Grans (Abs): 0 10*3/uL (ref 0.0–0.1)
Immature Granulocytes: 0 %
Lymphocytes Absolute: 1.8 10*3/uL (ref 0.7–3.1)
Lymphs: 33 %
MCH: 31 pg (ref 26.6–33.0)
MCHC: 33.1 g/dL (ref 31.5–35.7)
MCV: 94 fL (ref 79–97)
Monocytes Absolute: 0.4 10*3/uL (ref 0.1–0.9)
Monocytes: 7 %
Neutrophils Absolute: 3.2 10*3/uL (ref 1.4–7.0)
Neutrophils: 58 %
Platelets: 211 10*3/uL (ref 150–450)
RBC: 4.42 x10E6/uL (ref 3.77–5.28)
RDW: 13.1 % (ref 11.7–15.4)
WBC: 5.5 10*3/uL (ref 3.4–10.8)

## 2020-03-08 LAB — THYROID PANEL WITH TSH
Free Thyroxine Index: 1.8 (ref 1.2–4.9)
T3 Uptake Ratio: 24 % (ref 24–39)
T4, Total: 7.5 ug/dL (ref 4.5–12.0)
TSH: 2.63 u[IU]/mL (ref 0.450–4.500)

## 2020-07-11 ENCOUNTER — Encounter: Payer: Self-pay | Admitting: Nurse Practitioner

## 2020-07-11 ENCOUNTER — Other Ambulatory Visit: Payer: Self-pay | Admitting: Nurse Practitioner

## 2020-07-11 ENCOUNTER — Ambulatory Visit: Payer: Managed Care, Other (non HMO) | Admitting: Nurse Practitioner

## 2020-07-11 ENCOUNTER — Other Ambulatory Visit: Payer: Self-pay

## 2020-07-11 VITALS — BP 123/78 | HR 82 | Temp 97.7°F | Resp 20 | Ht 64.0 in | Wt 122.0 lb

## 2020-07-11 DIAGNOSIS — M7711 Lateral epicondylitis, right elbow: Secondary | ICD-10-CM

## 2020-07-11 DIAGNOSIS — B009 Herpesviral infection, unspecified: Secondary | ICD-10-CM

## 2020-07-11 MED ORDER — VALACYCLOVIR HCL 1 G PO TABS: 1000.0000 mg | ORAL_TABLET | Freq: Two times a day (BID) | ORAL | 1 refills | Status: DC

## 2020-07-11 NOTE — Patient Instructions (Signed)
Cold Sore  A cold sore, also called a fever blister, is a small, fluid-filled sore that forms inside of the mouth or on the lips, gums, nose, chin, or cheeks. Cold sores can spread to other parts of the body, such as the eyes or fingers. Cold sores can spread from person to person (are contagious) until the sores crust over completely. Most cold sores go away within 2 weeks. What are the causes? Cold sores are caused by a virus (herpes simplex virus type 1, HSV-1). The virus can spread from person to person through close contact, such as through:  Kissing.  Touching the affected area.  Sharing personal items such as lip balm, razors, a drinking glass, or eating utensils. What increases the risk? You are more likely to develop this condition if you:  Are tired, stressed, or sick.  Are having your period (menstruating).  Are pregnant.  Take certain medicines.  Are out in cold weather or get too much sun. What are the signs or symptoms? Symptoms of a cold sore outbreak go through different stages. These are the stages of a cold sore:  Tingling, itching, or burning is felt 1-2 days before the outbreak.  Fluid-filled blisters appear on the lips, inside the mouth, on the nose, or on the cheeks.  The blisters start to ooze clear fluid.  The blisters dry up, and a yellow crust appears in their place.  The crust falls off. In some cases, other symptoms can develop during a cold sore outbreak. These can include:  Fever.  Sore throat.  Headache.  Muscle aches.  Swollen neck glands. How is this treated? There is no cure for cold sores or the virus that causes them. There is also no vaccine to prevent the virus. Most cold sores go away on their own without treatment within 2 weeks. Your doctor may prescribe medicines to:  Help with pain.  Keep the virus from growing.  Help you heal faster. Medicines may be in the form of creams, gels, pills, or a shot. Follow these  instructions at home: Medicines  Take or apply over-the-counter and prescription medicines only as told by your doctor.  Use a cotton-tip swab to apply creams or gels to your sores.  Ask your doctor if you can take lysine supplements. These may help with healing. Sore care  Do not touch the sores or pick the scabs.  Wash your hands often. Do not touch your eyes without washing your hands first.  Keep the sores clean and dry.  If told, put ice on the sores: ? Put ice in a plastic bag. ? Place a towel between your skin and the bag. ? Leave the ice on for 20 minutes, 2-3 times a day.   Eating and drinking  Eat a soft, bland diet. Avoid eating hot, cold, or salty foods. These can hurt your mouth.  Use a straw if it hurts to drink out of a glass.  Eat foods that have a lot of lysine in them. These include meat, fish, and dairy products.  Avoid sugary foods, chocolates, nuts, and grains. These foods have a high amount of a substance (arginine) that can cause the virus to grow. Lifestyle  Do not kiss, have oral sex, or share personal items until your sores heal.  Stress, poor sleep, and being out in the sun can trigger outbreaks. Make sure you: ? Do activities that help you relax, such as deep breathing exercises or meditation. ? Get enough sleep. ? Apply   sunscreen on your lips before you go out in the sun. Contact a doctor if:  You have symptoms for more than 2 weeks.  You have pus coming from the sores.  You have redness that is spreading.  You have pain or irritation in your eye.  You get sores on your genitals.  Your sores do not heal within 2 weeks.  You get cold sores often. Get help right away if:  You have a fever and your symptoms suddenly get worse.  You have a headache and confusion.  You have tiredness (fatigue).  You do not want to eat as much as normal (loss of appetite).  You have a stiff neck or are sensitive to light. Summary  A cold sore is  a small, fluid-filled sore that forms inside of the mouth or on the lips, gums, nose, chin, or cheeks.  Cold sores can spread from person to person (are contagious) until the sores crust over completely. Most cold sores go away within 2 weeks.  Wash your hands often. Do not touch your eyes without washing your hands first.  Do not kiss, have oral sex, or share personal items until your sores heal.  Contact a doctor if your sores do not heal within 2 weeks. This information is not intended to replace advice given to you by your health care provider. Make sure you discuss any questions you have with your health care provider. Document Revised: 06/22/2018 Document Reviewed: 08/02/2017 Elsevier Patient Education  2021 [employer] Inc.  

## 2020-07-11 NOTE — Progress Notes (Signed)
   Subjective:    Patient ID: Renee Mitchell, female    DOB: 1975-04-10, 45 y.o.   MRN: [id number]   Chief Complaint: Bumps in vaginal area   HPI Patient comes in today c/o a "blister" in right groin area that she has had come and go for several years. This  time it has reoccurred and now has several bumps around it. Occurs about 1x per year and is sore to touch.   Review of Systems  Constitutional: Negative for diaphoresis.  Eyes: Negative for pain.  Respiratory: Negative for shortness of breath.   Cardiovascular: Negative for chest pain, palpitations and leg swelling.  Gastrointestinal: Negative for abdominal pain.  Endocrine: Negative for polydipsia.  Skin: Negative for rash.  Neurological: Negative for dizziness, weakness and headaches.  Hematological: Does not bruise/bleed easily.  All other systems reviewed and are negative.      Objective:   Physical Exam Vitals and nursing note reviewed.  Constitutional:      Appearance: Normal appearance.  Cardiovascular:     Rate and Rhythm: Normal rate and regular rhythm.     Heart sounds: Normal heart sounds.  Pulmonary:     Breath sounds: Normal breath sounds.  Skin:    General: Skin is warm.     Comments: Patch of vesicular lesions on right pubis  Neurological:     General: No focal deficit present.     Mental Status: She is alert and oriented to person, place, and time.  Psychiatric:        Mood and Affect: Mood normal.        Behavior: Behavior normal.     BP 123/78   Pulse 82   Temp 97.7 F (36.5 C) (Temporal)   Resp 20   Ht 5\' 4"  (1.626 m)   Wt 122 lb (55.3 kg)   BMI 20.94 kg/m        Assessment & Plan:  Renee Mitchell in today with chief complaint of Bumps in vaginal area   1. Herpes infection Meds ordered this encounter  Medications  . valACYclovir (VALTREX) 1000 MG tablet    Sig: Take 1 tablet (1,000 mg total) by mouth 2 (two) times daily for 10 days.    Dispense:  21 tablet    Refill:  1     Order Specific Question:   Supervising Provider    Answer:   Nicholaus Corolla A [[id number]]   Avoid rubbing or scratching area Cool compresses Areas can be contagious Refill on valtrex to take 2 in morning and 2 at night with next flare up RTO prn      The above assessment and management plan was discussed with the patient. The patient verbalized understanding of and has agreed to the management plan. Patient is aware to call the clinic if symptoms persist or worsen. Patient is aware when to return to the clinic for a follow-up visit. Patient educated on when it is appropriate to go to the emergency department.   Mary-Margaret Arville Care, FNP

## 2021-01-09 ENCOUNTER — Telehealth: Payer: Self-pay | Admitting: Nurse Practitioner

## 2021-01-09 NOTE — Telephone Encounter (Signed)
okay

## 2021-01-09 NOTE — Telephone Encounter (Signed)
That will be fine. 

## 2021-01-10 NOTE — Telephone Encounter (Signed)
Left detailed message pcp changed patient to call and change appt

## 2021-02-03 ENCOUNTER — Ambulatory Visit: Payer: Managed Care, Other (non HMO) | Admitting: Nurse Practitioner

## 2021-02-03 ENCOUNTER — Encounter: Payer: Self-pay | Admitting: Nurse Practitioner

## 2021-02-03 DIAGNOSIS — J101 Influenza due to other identified influenza virus with other respiratory manifestations: Secondary | ICD-10-CM | POA: Diagnosis not present

## 2021-02-03 DIAGNOSIS — R5081 Fever presenting with conditions classified elsewhere: Secondary | ICD-10-CM | POA: Diagnosis not present

## 2021-02-03 LAB — VERITOR FLU A/B WAIVED
Influenza A: POSITIVE — AB
Influenza B: NEGATIVE

## 2021-02-03 MED ORDER — OSELTAMIVIR PHOSPHATE 75 MG PO CAPS: 75.0000 mg | ORAL_CAPSULE | Freq: Two times a day (BID) | ORAL | 0 refills | Status: DC

## 2021-02-03 NOTE — Progress Notes (Signed)
Virtual Visit  Note Due to COVID-19 pandemic this visit was conducted virtually. This visit type was conducted due to national recommendations for restrictions regarding the COVID-19 Pandemic (e.g. social distancing, sheltering in place) in an effort to limit this patient's exposure and mitigate transmission in our community. All issues noted in this document were discussed and addressed.  A physical exam was not performed with this format.  I connected with Renee Mitchell on 02/03/21 at 9:59 by telephone and verified that I am speaking with the correct person using two identifiers. Renee Mitchell is currently located at home and no ne is currently with her during visit. The provider, Mary-Margaret Daphine Deutscher, FNP is located in their office at time of visit.  I discussed the limitations, risks, security and privacy concerns of performing an evaluation and management service by telephone and the availability of in person appointments. I also discussed with the patient that there may be a patient responsible charge related to this service. The patient expressed understanding and agreed to proceed.   History and Present Illness:  Patient developed body ache  with sore throat and fever. Has bad headache. Has been taking dayquil and nightquil with no help.    Review of Systems  Constitutional:  Positive for chills, fever and malaise/fatigue.  HENT:  Positive for congestion. Negative for sore throat.   Respiratory:  Positive for cough. Negative for sputum production.   Musculoskeletal:  Positive for myalgias.  Neurological:  Positive for headaches. Negative for dizziness.  All other systems reviewed and are negative.   Observations/Objective: Alert and oriented- answers all questions appropriately No distress Dry cough noted  Assessment and Plan: Patient came in and did flu swab Renee Mitchell in today with chief complaint of fever  1. Fever in other diseases - Veritor Flu A/B Waived  2.  Influenza A 1. Take meds as prescribed 2. Use a cool mist humidifier especially during the winter months and when heat has been humid. 3. Use saline nose sprays frequently 4. Saline irrigations of the nose can be very helpful if done frequently.  * 4X daily for 1 week*  * Use of a nettie pot can be helpful with this. Follow directions with this* 5. Drink plenty of fluids 6. Keep thermostat turn down low 7.For any cough or congestion  Use plain Mucinex- regular strength or max strength is fine   * Children- consult with Pharmacist for dosing 8. For fever or aces or pains- take tylenol or ibuprofen appropriate for age and weight.  * for fevers greater than 101 orally you may alternate ibuprofen and tylenol every  3 hours.       I discussed the assessment and treatment plan with the patient. The patient was provided an opportunity to ask questions and all were answered. The patient agreed with the plan and demonstrated an understanding of the instructions.   The patient was advised to call back or seek an in-person evaluation if the symptoms worsen or if the condition fails to improve as anticipated.  The above assessment and management plan was discussed with the patient. The patient verbalized understanding of and has agreed to the management plan. Patient is aware to call the clinic if symptoms persist or worsen. Patient is aware when to return to the clinic for a follow-up visit. Patient educated on when it is appropriate to go to the emergency department.   Time call ended:  11:07  I provided 12 minutes of  non face-to-face time during this  encounter.    Mary-Margaret Hassell Done, FNP

## 2021-02-10 ENCOUNTER — Telehealth: Payer: Managed Care, Other (non HMO) | Admitting: Family Medicine

## 2021-05-28 ENCOUNTER — Other Ambulatory Visit: Payer: Self-pay | Admitting: Nurse Practitioner

## 2021-06-09 ENCOUNTER — Encounter: Payer: Managed Care, Other (non HMO) | Admitting: Family Medicine

## 2021-06-17 ENCOUNTER — Encounter: Payer: Self-pay | Admitting: Family Medicine

## 2021-06-17 ENCOUNTER — Ambulatory Visit (INDEPENDENT_AMBULATORY_CARE_PROVIDER_SITE_OTHER): Payer: Managed Care, Other (non HMO) | Admitting: Family Medicine

## 2021-06-17 VITALS — BP 109/71 | HR 87 | Temp 97.7°F | Ht 64.0 in | Wt 121.4 lb

## 2021-06-17 DIAGNOSIS — Z0001 Encounter for general adult medical examination with abnormal findings: Secondary | ICD-10-CM | POA: Diagnosis not present

## 2021-06-17 DIAGNOSIS — Z1231 Encounter for screening mammogram for malignant neoplasm of breast: Secondary | ICD-10-CM

## 2021-06-17 DIAGNOSIS — Z0000 Encounter for general adult medical examination without abnormal findings: Secondary | ICD-10-CM

## 2021-06-17 DIAGNOSIS — M19042 Primary osteoarthritis, left hand: Secondary | ICD-10-CM

## 2021-06-17 DIAGNOSIS — B001 Herpesviral vesicular dermatitis: Secondary | ICD-10-CM

## 2021-06-17 DIAGNOSIS — E559 Vitamin D deficiency, unspecified: Secondary | ICD-10-CM

## 2021-06-17 DIAGNOSIS — M19041 Primary osteoarthritis, right hand: Secondary | ICD-10-CM

## 2021-06-17 LAB — URINALYSIS
Bilirubin, UA: NEGATIVE
Glucose, UA: NEGATIVE
Ketones, UA: NEGATIVE
Leukocytes,UA: NEGATIVE
Nitrite, UA: NEGATIVE
Protein,UA: NEGATIVE
RBC, UA: NEGATIVE
Specific Gravity, UA: <1.005 — ABNORMAL LOW (ref 1.005–1.030)
Urobilinogen, Ur: 0.2 mg/dL (ref 0.2–1.0)
pH, UA: 6.5 (ref 5.0–7.5)

## 2021-06-17 MED ORDER — LEVOCETIRIZINE DIHYDROCHLORIDE 5 MG PO TABS: 5.0000 mg | ORAL_TABLET | Freq: Every evening | ORAL | 3 refills | Status: DC

## 2021-06-17 MED ORDER — VALACYCLOVIR HCL 500 MG PO TABS: 500.0000 mg | ORAL_TABLET | Freq: Every day | ORAL | 3 refills | Status: DC

## 2021-06-17 NOTE — Progress Notes (Signed)
? ?Subjective:  ?Patient ID: Renee Mitchell, female    DOB: [birth year]  Age: 46 y.o. MRN: [id number] ? ?CC: Annual Exam ? ? ?HPI ?Renee Mitchell presents for cpe. Concerned about elevated cholesterol.  ?Taking claritin for allergy daily. Coughing a lot at night. No wheezing or dyspnea. Onset 2 weeks ago.  ? ? ?  06/17/2021  ?  1:15 PM 03/07/2020  ?  9:24 AM 03/13/2019  ?  2:32 PM  ?Depression screen PHQ 2/9  ?Decreased Interest 0 0 0  ?Down, Depressed, Hopeless 0 0 0  ?PHQ - 2 Score 0 0 0  ? ? ?History ?Renee Mitchell has no past medical history on file.  ? ?Renee Mitchell has no past surgical history on file.  ? ?Her family history includes Cystic fibrosis in her mother; Hypertension in her mother.Renee Mitchell reports that Renee Mitchell has never smoked. Renee Mitchell has never used smokeless tobacco. Renee Mitchell reports current alcohol use. Renee Mitchell reports that Renee Mitchell does not use drugs. ? ? ? ?ROS ?Review of Systems  ?Constitutional:  Negative for appetite change, chills, diaphoresis, fatigue, fever and unexpected weight change.  ?HENT:  Positive for hearing loss (loud work at [employer]. Wears ear plugs.). Negative for congestion, ear pain, postnasal drip, rhinorrhea, sneezing, sore throat and trouble swallowing.   ?Eyes:  Negative for pain.  ?Respiratory:  Negative for cough, chest tightness and shortness of breath.   ?Cardiovascular:  Negative for chest pain and palpitations.  ?Gastrointestinal:  Positive for abdominal pain (occasional heartburn with foods like tomato juice.). Negative for constipation, diarrhea, nausea and vomiting.  ?Endocrine: Negative for cold intolerance, heat intolerance, polydipsia, polyphagia and polyuria.  ?Genitourinary:  Negative for dysuria, frequency and menstrual problem.  ?Musculoskeletal:  Positive for arthralgias (hands, fingers q AM. Knees if not active.). Negative for joint swelling.  ?Skin:  Negative for rash.  ?Allergic/Immunologic: Negative for environmental allergies.  ?Neurological:  Positive for headaches (occasional severe. Last a day.  Relief with sleep. Vision changes occur.). Negative for dizziness, weakness and numbness.  ?Psychiatric/Behavioral:  Negative for agitation and dysphoric mood.   ? ?Objective:  ?BP 109/71   Pulse 87   Temp 97.7 ?F (36.5 ?C)   Ht _0  (1.626 m)   Wt 121 lb 6.4 oz (55.1 kg)   SpO2 98%   BMI 20.84 kg/m?  ? ?BP Readings from Last 3 Encounters:  ?06/17/21 109/71  ?07/11/20 123/78  ?03/07/20 105/65  ? ? ?Wt Readings from Last 3 Encounters:  ?06/17/21 121 lb 6.4 oz (55.1 kg)  ?07/11/20 122 lb (55.3 kg)  ?03/07/20 119 lb (54 kg)  ? ? ? ?Physical Exam ?Constitutional:   ?   General: Renee Mitchell is not in acute distress. ?   Appearance: Normal appearance. Renee Mitchell is well-developed.  ?HENT:  ?   Head: Normocephalic and atraumatic.  ?   Right Ear: External ear normal.  ?   Left Ear: External ear normal.  ?   Nose: Nose normal.  ?Eyes:  ?   Conjunctiva/sclera: Conjunctivae normal.  ?   Pupils: Pupils are equal, round, and reactive to light.  ?Neck:  ?   Thyroid: No thyromegaly.  ?Cardiovascular:  ?   Rate and Rhythm: Normal rate and regular rhythm.  ?   Heart sounds: Normal heart sounds. No murmur heard. ?Pulmonary:  ?   Effort: Pulmonary effort is normal. No respiratory distress.  ?   Breath sounds: Normal breath sounds. No wheezing or rales.  ?Chest:  ?Breasts: ?   Breasts are symmetrical.  ?  Right: No inverted nipple, mass or tenderness.  ?   Left: No inverted nipple, mass or tenderness.  ?Abdominal:  ?   General: Bowel sounds are normal. There is no distension or abdominal bruit.  ?   Palpations: Abdomen is soft. There is no hepatomegaly, splenomegaly or mass.  ?   Tenderness: There is no abdominal tenderness. Negative signs include Murphy's sign and McBurney's sign.  ?Musculoskeletal:     ?   General: No tenderness. Normal range of motion.  ?   Cervical back: Normal range of motion and neck supple.  ?Lymphadenopathy:  ?   Cervical: No cervical adenopathy.  ?Skin: ?   General: Skin is warm and dry.  ?   Findings: No rash.   ?Neurological:  ?   Mental Status: Renee Mitchell is alert and oriented to person, place, and time.  ?   Deep Tendon Reflexes: Reflexes are normal and symmetric.  ?Psychiatric:     ?   Behavior: Behavior normal.     ?   Thought Content: Thought content normal.     ?   Judgment: Judgment normal.  ? ? ? ? ?Assessment & Plan:  ? ?Renee Mitchell was seen today for annual exam. ? ?Diagnoses and all orders for this visit: ? ?Well adult exam ?-     CBC with Differential/Platelet ?-     CMP14+EGFR ?-     Lipid panel ?-     Urinalysis ?-     VITAMIN D 25 Hydroxy (Vit-D Deficiency, Fractures) ? ?Encounter for screening mammogram for malignant neoplasm of breast ?-     MM 3D SCREEN BREAST BILATERAL; Future ? ?Vitamin D deficiency ?-     VITAMIN D 25 Hydroxy (Vit-D Deficiency, Fractures) ? ?Herpes labialis ? ?Arthritis of finger of both hands ? ?Other orders ?-     valACYclovir (VALTREX) 500 MG tablet; Take 1 tablet (500 mg total) by mouth daily. ?-     levocetirizine (XYZAL) 5 MG tablet; Take 1 tablet (5 mg total) by mouth every evening. ? ? ? ? ? ? ?I have discontinued Renee Mitchell's naproxen and oseltamivir. I have also changed her valACYclovir. Additionally, I am having her start on levocetirizine. ? ?Allergies as of 06/17/2021   ?No Known Allergies ?  ? ?  ?Medication List  ?  ? ?  ? Accurate as of June 17, 2021  2:26 PM. If you have any questions, ask your nurse or doctor.  ?  ?  ? ?  ? ?STOP taking these medications   ? ?naproxen 500 MG tablet ?Commonly known as: NAPROSYN ?Stopped by: Claretta Fraise, MD ?  ?oseltamivir 75 MG capsule ?Commonly known as: Tamiflu ?Stopped by: Claretta Fraise, MD ?  ? ?  ? ?TAKE these medications   ? ?levocetirizine 5 MG tablet ?Commonly known as: XYZAL ?Take 1 tablet (5 mg total) by mouth every evening. ?Started by: Claretta Fraise, MD ?  ?valACYclovir 500 MG tablet ?Commonly known as: VALTREX ?Take 1 tablet (500 mg total) by mouth daily. ?What changed:  ?medication strength ?how much to take ?when to take  this ?additional instructions ?Changed by: Claretta Fraise, MD ?  ? ?  ? ? ? ?Follow-up: Return in about 1 year (around 06/18/2022) for [facility] physical. ? ?Claretta Fraise, M.D. ?

## 2021-06-18 ENCOUNTER — Other Ambulatory Visit: Payer: Self-pay | Admitting: Family Medicine

## 2021-06-18 LAB — CMP14+EGFR
ALT: 53 IU/L — ABNORMAL HIGH (ref 0–32)
AST: 29 IU/L (ref 0–40)
Albumin/Globulin Ratio: 1.9 (ref 1.2–2.2)
Albumin: 5 g/dL — ABNORMAL HIGH (ref 3.8–4.8)
Alkaline Phosphatase: 123 IU/L — ABNORMAL HIGH (ref 44–121)
BUN/Creatinine Ratio: 12 (ref 9–23)
BUN: 9 mg/dL (ref 6–24)
Bilirubin Total: 0.2 mg/dL (ref 0.0–1.2)
CO2: 26 mmol/L (ref 20–29)
Calcium: 10.4 mg/dL — ABNORMAL HIGH (ref 8.7–10.2)
Chloride: 100 mmol/L (ref 96–106)
Creatinine, Ser: 0.76 mg/dL (ref 0.57–1.00)
Globulin, Total: 2.6 g/dL (ref 1.5–4.5)
Glucose: 91 mg/dL (ref 70–99)
Potassium: 4.9 mmol/L (ref 3.5–5.2)
Sodium: 141 mmol/L (ref 134–144)
Total Protein: 7.6 g/dL (ref 6.0–8.5)
eGFR: 98 mL/min/{1.73_m2} (ref >59)

## 2021-06-18 LAB — CBC WITH DIFFERENTIAL/PLATELET
Basophils Absolute: 0 10*3/uL (ref 0.0–0.2)
Basos: 1 %
EOS (ABSOLUTE): 0.1 10*3/uL (ref 0.0–0.4)
Eos: 1 %
Hematocrit: 44.9 % (ref 34.0–46.6)
Hemoglobin: 15 g/dL (ref 11.1–15.9)
Immature Grans (Abs): 0 10*3/uL (ref 0.0–0.1)
Immature Granulocytes: 1 %
Lymphocytes Absolute: 2.3 10*3/uL (ref 0.7–3.1)
Lymphs: 44 %
MCH: 31.1 pg (ref 26.6–33.0)
MCHC: 33.4 g/dL (ref 31.5–35.7)
MCV: 93 fL (ref 79–97)
Monocytes Absolute: 0.3 10*3/uL (ref 0.1–0.9)
Monocytes: 6 %
Neutrophils Absolute: 2.5 10*3/uL (ref 1.4–7.0)
Neutrophils: 47 %
Platelets: 350 10*3/uL (ref 150–450)
RBC: 4.83 x10E6/uL (ref 3.77–5.28)
RDW: 12.8 % (ref 11.7–15.4)
WBC: 5.3 10*3/uL (ref 3.4–10.8)

## 2021-06-18 LAB — LIPID PANEL
Chol/HDL Ratio: 4 ratio (ref 0.0–4.4)
Cholesterol, Total: 301 mg/dL — ABNORMAL HIGH (ref 100–199)
HDL: 75 mg/dL (ref >39)
LDL Chol Calc (NIH): 206 mg/dL — ABNORMAL HIGH (ref 0–99)
Triglycerides: 114 mg/dL (ref 0–149)
VLDL Cholesterol Cal: 20 mg/dL (ref 5–40)

## 2021-06-18 LAB — VITAMIN D 25 HYDROXY (VIT D DEFICIENCY, FRACTURES): Vit D, 25-Hydroxy: 33.9 ng/mL (ref 30.0–100.0)

## 2021-06-18 MED ORDER — ROSUVASTATIN CALCIUM 10 MG PO TABS: 10.0000 mg | ORAL_TABLET | Freq: Every day | ORAL | 1 refills | Status: DC

## 2021-08-20 ENCOUNTER — Ambulatory Visit
Admission: RE | Admit: 2021-08-20 | Discharge: 2021-08-20 | Disposition: A | Discharge status: Home / Self Care | Payer: 59 | Source: Ambulatory Visit | Attending: Family Medicine | Admitting: Family Medicine

## 2021-08-20 DIAGNOSIS — Z1231 Encounter for screening mammogram for malignant neoplasm of breast: Secondary | ICD-10-CM

## 2022-03-19 ENCOUNTER — Other Ambulatory Visit: Payer: Self-pay | Admitting: Family Medicine

## 2022-04-20 ENCOUNTER — Other Ambulatory Visit: Payer: Self-pay | Admitting: *Deleted

## 2022-04-20 ENCOUNTER — Telehealth: Payer: Self-pay | Admitting: Family Medicine

## 2022-04-20 DIAGNOSIS — Z0000 Encounter for general adult medical examination without abnormal findings: Secondary | ICD-10-CM

## 2022-04-20 MED ORDER — ROSUVASTATIN CALCIUM 10 MG PO TABS: 10.0000 mg | ORAL_TABLET | Freq: Every day | ORAL | 0 refills | Status: DC

## 2022-04-20 NOTE — Telephone Encounter (Signed)
done 

## 2022-04-20 NOTE — Telephone Encounter (Signed)
Pt aware refill sent to pharmacy 

## 2022-04-20 NOTE — Telephone Encounter (Signed)
  Prescription Request  04/20/2022  Is this a "Controlled Substance" medicine? no  Have you seen your PCP in the last 2 weeks? no  If YES, route message to pool  -  If NO, patient needs to be scheduled for appointment.  What is the name of the medication or equipment? Rosuvastatin 10 mg. Patient has appt 4-16 for complete physical  Have you contacted your pharmacy to request a refill? no   Which pharmacy would you like this sent to? [facility]   Patient notified that their request is being sent to the clinical staff for review and that they should receive a response within 2 business days.

## 2022-06-29 ENCOUNTER — Other Ambulatory Visit: Payer: Managed Care, Other (non HMO)

## 2022-06-29 DIAGNOSIS — Z0000 Encounter for general adult medical examination without abnormal findings: Secondary | ICD-10-CM

## 2022-06-29 LAB — URINALYSIS
Bilirubin, UA: NEGATIVE
Glucose, UA: NEGATIVE
Ketones, UA: NEGATIVE
Leukocytes,UA: NEGATIVE
Nitrite, UA: NEGATIVE
Protein,UA: NEGATIVE
RBC, UA: NEGATIVE
Specific Gravity, UA: 1.01 (ref 1.005–1.030)
Urobilinogen, Ur: 0.2 mg/dL (ref 0.2–1.0)
pH, UA: 6 (ref 5.0–7.5)

## 2022-06-29 LAB — CBC WITH DIFFERENTIAL/PLATELET
Basophils Absolute: 0 10*3/uL (ref 0.0–0.2)
Basos: 0 %
Eos: 2 %
Hematocrit: 41.6 % (ref 34.0–46.6)
Immature Granulocytes: 0 %
Lymphocytes Absolute: 2.1 10*3/uL (ref 0.7–3.1)
Lymphs: 40 %
Neutrophils: 51 %
WBC: 5.2 10*3/uL (ref 3.4–10.8)

## 2022-06-30 ENCOUNTER — Encounter: Payer: Self-pay | Admitting: Family Medicine

## 2022-06-30 ENCOUNTER — Ambulatory Visit (INDEPENDENT_AMBULATORY_CARE_PROVIDER_SITE_OTHER): Payer: Managed Care, Other (non HMO) | Admitting: Family Medicine

## 2022-06-30 ENCOUNTER — Other Ambulatory Visit (HOSPITAL_COMMUNITY)
Admission: RE | Admit: 2022-06-30 | Discharge: 2022-06-30 | Disposition: A | Discharge status: Home / Self Care | Payer: Managed Care, Other (non HMO) | Source: Ambulatory Visit | Attending: Family Medicine | Admitting: Family Medicine

## 2022-06-30 VITALS — BP 108/62 | HR 69 | Temp 97.8°F | Ht 64.0 in | Wt 120.2 lb

## 2022-06-30 DIAGNOSIS — B001 Herpesviral vesicular dermatitis: Secondary | ICD-10-CM

## 2022-06-30 DIAGNOSIS — E782 Mixed hyperlipidemia: Secondary | ICD-10-CM | POA: Diagnosis not present

## 2022-06-30 DIAGNOSIS — Z124 Encounter for screening for malignant neoplasm of cervix: Secondary | ICD-10-CM

## 2022-06-30 DIAGNOSIS — Z0000 Encounter for general adult medical examination without abnormal findings: Secondary | ICD-10-CM

## 2022-06-30 DIAGNOSIS — Z0001 Encounter for general adult medical examination with abnormal findings: Secondary | ICD-10-CM

## 2022-06-30 DIAGNOSIS — E559 Vitamin D deficiency, unspecified: Secondary | ICD-10-CM | POA: Diagnosis not present

## 2022-06-30 DIAGNOSIS — J301 Allergic rhinitis due to pollen: Secondary | ICD-10-CM | POA: Insufficient documentation

## 2022-06-30 DIAGNOSIS — Z01419 Encounter for gynecological examination (general) (routine) without abnormal findings: Secondary | ICD-10-CM

## 2022-06-30 LAB — CMP14+EGFR
ALT: 43 IU/L — ABNORMAL HIGH (ref 0–32)
AST: 25 IU/L (ref 0–40)
Albumin/Globulin Ratio: 2 (ref 1.2–2.2)
Albumin: 4.5 g/dL (ref 3.9–4.9)
Alkaline Phosphatase: 92 IU/L (ref 44–121)
BUN/Creatinine Ratio: 12 (ref 9–23)
BUN: 9 mg/dL (ref 6–24)
Bilirubin Total: 0.3 mg/dL (ref 0.0–1.2)
CO2: 21 mmol/L (ref 20–29)
Calcium: 9.5 mg/dL (ref 8.7–10.2)
Chloride: 104 mmol/L (ref 96–106)
Creatinine, Ser: 0.77 mg/dL (ref 0.57–1.00)
Globulin, Total: 2.2 g/dL (ref 1.5–4.5)
Glucose: 92 mg/dL (ref 70–99)
Potassium: 4.8 mmol/L (ref 3.5–5.2)
Sodium: 140 mmol/L (ref 134–144)
Total Protein: 6.7 g/dL (ref 6.0–8.5)
eGFR: 96 mL/min/{1.73_m2} (ref >59)

## 2022-06-30 LAB — LIPID PANEL
Chol/HDL Ratio: 2.4 ratio (ref 0.0–4.4)
Cholesterol, Total: 179 mg/dL (ref 100–199)
HDL: 74 mg/dL (ref >39)
LDL Chol Calc (NIH): 93 mg/dL (ref 0–99)
Triglycerides: 60 mg/dL (ref 0–149)
VLDL Cholesterol Cal: 12 mg/dL (ref 5–40)

## 2022-06-30 LAB — CBC WITH DIFFERENTIAL/PLATELET
EOS (ABSOLUTE): 0.1 10*3/uL (ref 0.0–0.4)
Hemoglobin: 14.1 g/dL (ref 11.1–15.9)
Immature Grans (Abs): 0 10*3/uL (ref 0.0–0.1)
MCH: 32.3 pg (ref 26.6–33.0)
MCHC: 33.9 g/dL (ref 31.5–35.7)
MCV: 95 fL (ref 79–97)
Monocytes Absolute: 0.4 10*3/uL (ref 0.1–0.9)
Monocytes: 7 %
Neutrophils Absolute: 2.7 10*3/uL (ref 1.4–7.0)
Platelets: 258 10*3/uL (ref 150–450)
RBC: 4.37 x10E6/uL (ref 3.77–5.28)
RDW: 12.8 % (ref 11.7–15.4)

## 2022-06-30 LAB — VITAMIN D 25 HYDROXY (VIT D DEFICIENCY, FRACTURES): Vit D, 25-Hydroxy: 29.9 ng/mL — ABNORMAL LOW (ref 30.0–100.0)

## 2022-06-30 MED ORDER — ROSUVASTATIN CALCIUM 10 MG PO TABS: 10.0000 mg | ORAL_TABLET | Freq: Every day | ORAL | 3 refills | Status: DC

## 2022-06-30 MED ORDER — VALACYCLOVIR HCL 500 MG PO TABS: 500.0000 mg | ORAL_TABLET | Freq: Every day | ORAL | 3 refills | Status: DC

## 2022-06-30 MED ORDER — LEVOCETIRIZINE DIHYDROCHLORIDE 5 MG PO TABS: 5.0000 mg | ORAL_TABLET | Freq: Every evening | ORAL | 3 refills | Status: DC

## 2022-06-30 MED ORDER — VITAMIN D 125 MCG (5000 UT) PO CAPS
5000.0000 [IU] | ORAL_CAPSULE | Freq: Every day | ORAL | 3 refills | Status: AC
Start: 2022-06-30 — End: ?

## 2022-06-30 NOTE — Progress Notes (Signed)
Subjective:  Patient ID: Renee Mitchell, female    DOB: 1975-04-24  Age: 47 y.o. MRN: [id number]  CC: Annual Exam   HPI Alfonso Shackett presents for Annual exam. Having some forgetfulness. Also urinary sx with the valtrex so switched to the prn dose for herpes cold sores. Xyzal essential through pollen season.      06/30/2022   10:13 AM 06/17/2021    1:15 PM 03/07/2020    9:24 AM  Depression screen PHQ 2/9  Decreased Interest 0 0 0  Down, Depressed, Hopeless 0 0 0  PHQ - 2 Score 0 0 0    History Marg has no past medical history on file.   She has no past surgical history on file.   Her family history includes Cystic fibrosis in her mother; Hypertension in her mother.She reports that she has never smoked. She has never used smokeless tobacco. She reports current alcohol use. She reports that she does not use drugs.    ROS Review of Systems  Constitutional:  Negative for appetite change, chills, diaphoresis, fatigue, fever and unexpected weight change.  HENT:  Negative for congestion, ear pain, hearing loss, postnasal drip, rhinorrhea, sneezing, sore throat and trouble swallowing.   Eyes:  Negative for pain.  Respiratory:  Negative for cough, chest tightness and shortness of breath.   Cardiovascular:  Negative for chest pain and palpitations.  Gastrointestinal:  Negative for abdominal pain, constipation, diarrhea, nausea and vomiting.  Endocrine: Negative for cold intolerance, heat intolerance, polydipsia, polyphagia and polyuria.  Genitourinary:  Negative for dysuria, frequency and menstrual problem.  Musculoskeletal:  Negative for arthralgias and joint swelling.  Skin:  Negative for rash.  Allergic/Immunologic: Negative for environmental allergies.  Neurological:  Negative for dizziness, weakness, numbness and headaches.  Psychiatric/Behavioral:  Negative for agitation and dysphoric mood.     Objective:  BP 108/62   Pulse 69   Temp 97.8 F (36.6 C)   Ht  (1.626 m)    Wt 120 lb 3.2 oz (54.5 kg)   SpO2 100%   BMI 20.63 kg/m   BP Readings from Last 3 Encounters:  06/30/22 108/62  06/17/21 109/71  07/11/20 123/78    Wt Readings from Last 3 Encounters:  06/30/22 120 lb 3.2 oz (54.5 kg)  06/17/21 121 lb 6.4 oz (55.1 kg)  07/11/20 122 lb (55.3 kg)     Physical Exam Exam conducted with a chaperone present.  Constitutional:      General: She is not in acute distress.    Appearance: Normal appearance. She is well-developed.  HENT:     Head: Normocephalic and atraumatic.     Right Ear: External ear normal.     Left Ear: External ear normal.     Nose: Nose normal.  Eyes:     Conjunctiva/sclera: Conjunctivae normal.     Pupils: Pupils are equal, round, and reactive to light.  Neck:     Thyroid: No thyromegaly.  Cardiovascular:     Rate and Rhythm: Normal rate and regular rhythm.     Heart sounds: Normal heart sounds. No murmur heard. Pulmonary:     Effort: Pulmonary effort is normal. No respiratory distress.     Breath sounds: Normal breath sounds. No wheezing or rales.  Chest:  Breasts:    Breasts are symmetrical.     Right: No inverted nipple, mass or tenderness.     Left: No inverted nipple, mass or tenderness.  Abdominal:     General: Bowel sounds are normal. There  is no distension or abdominal bruit.     Palpations: Abdomen is soft. There is no hepatomegaly, splenomegaly or mass.     Tenderness: There is no abdominal tenderness. Negative signs include Murphy's sign and McBurney's sign.  Genitourinary:    General: Normal vulva.     Exam position: Lithotomy position.     Labia:        Right: No lesion.      Vagina: Normal.     Cervix: No discharge, friability or lesion.     Rectum: Normal.  Musculoskeletal:        General: No tenderness. Normal range of motion.     Cervical back: Normal range of motion and neck supple.  Lymphadenopathy:     Cervical: No cervical adenopathy.  Skin:    General: Skin is warm and dry.      Findings: No rash.  Neurological:     Mental Status: She is alert and oriented to person, place, and time.     Deep Tendon Reflexes: Reflexes are normal and symmetric.  Psychiatric:        Behavior: Behavior normal.        Thought Content: Thought content normal.        Judgment: Judgment normal.       Assessment & Plan:   [place] was seen today for annual exam.  Diagnoses and all orders for this visit:  Well adult exam  Seasonal allergic rhinitis due to pollen -     levocetirizine (XYZAL) 5 MG tablet; Take 1 tablet (5 mg total) by mouth every evening.  Vitamin D deficiency -     Cholecalciferol (VITAMIN D) 125 MCG (5000 UT) CAPS; Take 5,000 Units by mouth daily.  Gynecologic exam normal -     Pap IG (Image Guided)  Mixed hyperlipidemia -     rosuvastatin (CRESTOR) 10 MG tablet; Take 1 tablet (10 mg total) by mouth daily. for cholesterol.  Herpes labialis -     valACYclovir (VALTREX) 500 MG tablet; Take 1 tablet (500 mg total) by mouth daily.       I am having Nicholaus Corolla start on Vitamin D. I am also having her maintain her levocetirizine, rosuvastatin, and valACYclovir.  Allergies as of 06/30/2022   No Known Allergies      Medication List        Accurate as of June 30, 2022 11:12 AM. If you have any questions, ask your nurse or doctor.          levocetirizine 5 MG tablet Commonly known as: XYZAL Take 1 tablet (5 mg total) by mouth every evening.   rosuvastatin 10 MG tablet Commonly known as: CRESTOR Take 1 tablet (10 mg total) by mouth daily. for cholesterol.   valACYclovir 500 MG tablet Commonly known as: VALTREX Take 1 tablet (500 mg total) by mouth daily.   Vitamin D 125 MCG (5000 UT) Caps Take 5,000 Units by mouth daily. Started by: Mechele Claude, MD         Follow-up: Return in about 1 year (around 06/30/2023).  Mechele Claude, M.D.

## 2022-06-30 NOTE — Addendum Note (Signed)
Addended by: Adella Hare B on: 06/30/2022 11:36 AM   Modules accepted: Orders

## 2022-07-01 LAB — CYTOLOGY - PAP: Diagnosis: NEGATIVE

## 2022-07-01 NOTE — Progress Notes (Signed)
Your recent Pap smear performed in the office came back normal.

## 2023-06-14 ENCOUNTER — Ambulatory Visit: Payer: Self-pay | Admitting: Family Medicine

## 2023-06-24 ENCOUNTER — Other Ambulatory Visit: Payer: Self-pay | Admitting: Family Medicine

## 2023-06-24 DIAGNOSIS — J301 Allergic rhinitis due to pollen: Secondary | ICD-10-CM

## 2023-07-08 ENCOUNTER — Encounter: Payer: Self-pay | Admitting: Family Medicine

## 2023-07-09 ENCOUNTER — Other Ambulatory Visit: Payer: Self-pay | Admitting: Family Medicine

## 2023-07-09 DIAGNOSIS — E782 Mixed hyperlipidemia: Secondary | ICD-10-CM

## 2023-07-29 ENCOUNTER — Ambulatory Visit: Payer: Self-pay | Admitting: Family Medicine

## 2023-07-29 ENCOUNTER — Encounter: Payer: Self-pay | Admitting: Family Medicine

## 2023-07-29 VITALS — BP 107/71 | HR 66 | Temp 97.8°F | Ht 64.0 in | Wt 120.0 lb

## 2023-07-29 DIAGNOSIS — E782 Mixed hyperlipidemia: Secondary | ICD-10-CM | POA: Diagnosis not present

## 2023-07-29 DIAGNOSIS — E559 Vitamin D deficiency, unspecified: Secondary | ICD-10-CM | POA: Diagnosis not present

## 2023-07-29 DIAGNOSIS — Z0000 Encounter for general adult medical examination without abnormal findings: Secondary | ICD-10-CM

## 2023-07-29 DIAGNOSIS — B001 Herpesviral vesicular dermatitis: Secondary | ICD-10-CM

## 2023-07-29 DIAGNOSIS — R5383 Other fatigue: Secondary | ICD-10-CM

## 2023-07-29 DIAGNOSIS — Z0001 Encounter for general adult medical examination with abnormal findings: Secondary | ICD-10-CM | POA: Diagnosis not present

## 2023-07-29 LAB — LIPID PANEL

## 2023-07-29 MED ORDER — VALACYCLOVIR HCL 500 MG PO TABS: 500.0000 mg | ORAL_TABLET | Freq: Every day | ORAL | 3 refills | Status: AC

## 2023-07-29 MED ORDER — ROSUVASTATIN CALCIUM 10 MG PO TABS: 10.0000 mg | ORAL_TABLET | Freq: Every day | ORAL | 0 refills | Status: DC

## 2023-07-29 NOTE — Progress Notes (Signed)
 Subjective:  Patient ID: Renee Mitchell, female    DOB: 1975/12/08  Age: 48 y.o. MRN: [id number]  CC: Annual Exam (No concerns at this time. )   HPI Aveline Murgia presents for Annual physical. (Pap up to date)     06/30/2022   10:13 AM 06/17/2021    1:15 PM 03/07/2020    9:24 AM  Depression screen PHQ 2/9  Decreased Interest 0 0 0  Down, Depressed, Hopeless 0 0 0  PHQ - 2 Score 0 0 0    History Avagrace has no past medical history on file.   She has no past surgical history on file.   Her family history includes Cystic fibrosis in her mother; Hypertension in her mother.She reports that she has never smoked. She has never used smokeless tobacco. She reports current alcohol use. She reports that she does not use drugs.    ROS Review of Systems  Constitutional: Negative.  Negative for appetite change, chills, diaphoresis, fatigue, fever and unexpected weight change.  HENT: Negative.  Negative for congestion, ear pain, hearing loss, postnasal drip, rhinorrhea, sneezing, sore throat and trouble swallowing.   Eyes:  Negative for pain and visual disturbance.  Respiratory:  Negative for cough, chest tightness and shortness of breath.   Cardiovascular:  Negative for chest pain and palpitations.  Gastrointestinal:  Negative for abdominal pain, constipation, diarrhea, nausea and vomiting.  Endocrine: Negative for cold intolerance, heat intolerance, polydipsia, polyphagia and polyuria.  Genitourinary:  Negative for dysuria, frequency and menstrual problem.  Musculoskeletal:  Negative for arthralgias and joint swelling.  Skin:  Negative for rash.  Allergic/Immunologic: Negative for environmental allergies.  Neurological:  Positive for headaches (occasional, mild, gets a little dehydrated.). Negative for dizziness, weakness and numbness.  Psychiatric/Behavioral:  Negative for agitation and dysphoric mood. The patient is not nervous/anxious.     Objective:  BP 107/71   Pulse 66   Temp 97.8  F (36.6 C)   Ht 5\' 4"  (1.626 m)   Wt 120 lb (54.4 kg)   SpO2 98%   BMI 20.60 kg/m   BP Readings from Last 3 Encounters:  07/29/23 107/71  06/30/22 108/62  06/17/21 109/71    Wt Readings from Last 3 Encounters:  07/29/23 120 lb (54.4 kg)  06/30/22 120 lb 3.2 oz (54.5 kg)  06/17/21 121 lb 6.4 oz (55.1 kg)     Physical Exam Constitutional:      General: She is not in acute distress.    Appearance: Normal appearance. She is well-developed.  HENT:     Head: Normocephalic and atraumatic.     Right Ear: External ear normal.     Left Ear: External ear normal.     Nose: Nose normal.  Eyes:     Conjunctiva/sclera: Conjunctivae normal.     Pupils: Pupils are equal, round, and reactive to light.  Neck:     Thyroid: No thyromegaly.  Cardiovascular:     Rate and Rhythm: Normal rate and regular rhythm.     Heart sounds: Normal heart sounds. No murmur heard. Pulmonary:     Effort: Pulmonary effort is normal. No respiratory distress.     Breath sounds: Normal breath sounds. No wheezing or rales.  Chest:  Breasts:    Breasts are symmetrical.     Right: No inverted nipple, mass or tenderness.     Left: No inverted nipple, mass or tenderness.  Abdominal:     General: Bowel sounds are normal. There is no distension or abdominal bruit.  Palpations: Abdomen is soft. There is no hepatomegaly, splenomegaly or mass.     Tenderness: There is no abdominal tenderness. Negative signs include Murphy's sign and McBurney's sign.  Musculoskeletal:        General: No tenderness. Normal range of motion.     Cervical back: Normal range of motion and neck supple.  Lymphadenopathy:     Cervical: No cervical adenopathy.  Skin:    General: Skin is warm and dry.     Findings: No rash.  Neurological:     Mental Status: She is alert and oriented to person, place, and time.     Deep Tendon Reflexes: Reflexes are normal and symmetric.  Psychiatric:        Behavior: Behavior normal.         Thought Content: Thought content normal.        Judgment: Judgment normal.      Assessment & Plan:  Well adult exam -     CBC with Differential/Platelet -     CMP14+EGFR -     Lipid panel -     VITAMIN D  25 Hydroxy (Vit-D Deficiency, Fractures)  Mixed hyperlipidemia -     CMP14+EGFR -     Lipid panel -     Rosuvastatin  Calcium ; Take 1 tablet (10 mg total) by mouth daily. for cholesterol.  Dispense: 90 tablet; Refill: 0  Vitamin D  deficiency -     VITAMIN D  25 Hydroxy (Vit-D Deficiency, Fractures)  Other fatigue -     CBC with Differential/Platelet -     CMP14+EGFR  Herpes labialis -     valACYclovir  HCl; Take 1 tablet (500 mg total) by mouth daily.  Dispense: 90 tablet; Refill: 3     Follow-up: Return in about 1 year (around 07/28/2024) for [facility] physical.  Roise Cleaver, M.D.

## 2023-07-30 LAB — CMP14+EGFR
ALT: 20 IU/L (ref 0–32)
AST: 15 IU/L (ref 0–40)
Albumin: 4.5 g/dL (ref 3.9–4.9)
Alkaline Phosphatase: 56 IU/L (ref 44–121)
BUN/Creatinine Ratio: 11 (ref 9–23)
BUN: 8 mg/dL (ref 6–24)
Bilirubin Total: 0.3 mg/dL (ref 0.0–1.2)
CO2: 22 mmol/L (ref 20–29)
Calcium: 9.6 mg/dL (ref 8.7–10.2)
Chloride: 102 mmol/L (ref 96–106)
Creatinine, Ser: 0.72 mg/dL (ref 0.57–1.00)
Globulin, Total: 2.2 g/dL (ref 1.5–4.5)
Glucose: 79 mg/dL (ref 70–99)
Potassium: 3.9 mmol/L (ref 3.5–5.2)
Sodium: 141 mmol/L (ref 134–144)
Total Protein: 6.7 g/dL (ref 6.0–8.5)
eGFR: 102 mL/min/{1.73_m2} (ref >59)

## 2023-07-30 LAB — CBC WITH DIFFERENTIAL/PLATELET
Basophils Absolute: 0 10*3/uL (ref 0.0–0.2)
Basos: 0 %
EOS (ABSOLUTE): 0.1 10*3/uL (ref 0.0–0.4)
Eos: 1 %
Hematocrit: 40.4 % (ref 34.0–46.6)
Hemoglobin: 13 g/dL (ref 11.1–15.9)
Immature Grans (Abs): 0 10*3/uL (ref 0.0–0.1)
Immature Granulocytes: 0 %
Lymphocytes Absolute: 2.3 10*3/uL (ref 0.7–3.1)
Lymphs: 45 %
MCH: 31 pg (ref 26.6–33.0)
MCHC: 32.2 g/dL (ref 31.5–35.7)
MCV: 96 fL (ref 79–97)
Monocytes Absolute: 0.3 10*3/uL (ref 0.1–0.9)
Monocytes: 6 %
Neutrophils Absolute: 2.4 10*3/uL (ref 1.4–7.0)
Neutrophils: 48 %
Platelets: 226 10*3/uL (ref 150–450)
RBC: 4.19 x10E6/uL (ref 3.77–5.28)
RDW: 12.9 % (ref 11.7–15.4)
WBC: 5 10*3/uL (ref 3.4–10.8)

## 2023-07-30 LAB — LIPID PANEL
Cholesterol, Total: 184 mg/dL (ref 100–199)
HDL: 79 mg/dL (ref >39)
LDL CALC COMMENT:: 2.3 ratio (ref 0.0–4.4)
LDL Chol Calc (NIH): 94 mg/dL (ref 0–99)
Triglycerides: 60 mg/dL (ref 0–149)
VLDL Cholesterol Cal: 11 mg/dL (ref 5–40)

## 2023-07-30 LAB — VITAMIN D 25 HYDROXY (VIT D DEFICIENCY, FRACTURES): Vit D, 25-Hydroxy: 47.4 ng/mL (ref 30.0–100.0)

## 2023-08-01 ENCOUNTER — Ambulatory Visit: Payer: Self-pay | Admitting: Family Medicine

## 2023-08-01 NOTE — Progress Notes (Signed)
 Hello Kynnadi,  Your lab result is normal and/or stable.Some minor variations that are not significant are commonly marked abnormal, but do not represent any medical problem for you.  Best regards, Roise Cleaver, M.D.

## 2023-12-17 ENCOUNTER — Other Ambulatory Visit: Payer: Self-pay | Admitting: Family Medicine

## 2023-12-17 DIAGNOSIS — J301 Allergic rhinitis due to pollen: Secondary | ICD-10-CM

## 2024-01-06 ENCOUNTER — Other Ambulatory Visit: Payer: Self-pay | Admitting: Family Medicine

## 2024-01-06 DIAGNOSIS — E782 Mixed hyperlipidemia: Secondary | ICD-10-CM

## 2024-03-15 ENCOUNTER — Other Ambulatory Visit: Payer: Self-pay | Admitting: Family Medicine

## 2024-03-15 DIAGNOSIS — J301 Allergic rhinitis due to pollen: Secondary | ICD-10-CM

## 2024-06-13 ENCOUNTER — Encounter: Admitting: Family Medicine

## 2024-08-01 ENCOUNTER — Encounter: Payer: Self-pay | Admitting: Family Medicine
